# Patient Record
Sex: Male | Born: 1980 | Hispanic: Yes | Smoking: Never smoker
Health system: Southern US, Community
[De-identification: ages and names within clinical notes are randomized; demographics above are authoritative.]

## PROBLEM LIST (undated history)

## (undated) DIAGNOSIS — I8390 Asymptomatic varicose veins of unspecified lower extremity: Secondary | ICD-10-CM

## (undated) DIAGNOSIS — E785 Hyperlipidemia, unspecified: Secondary | ICD-10-CM

## (undated) HISTORY — DX: Hyperlipidemia, unspecified: E78.5

## (undated) HISTORY — DX: Asymptomatic varicose veins of unspecified lower extremity: I83.90

## (undated) HISTORY — PX: LASER ABLATION: SHX1947

## (undated) HISTORY — PX: MENISCECTOMY: SHX123

---

## 2015-03-21 ENCOUNTER — Encounter: Payer: Self-pay | Admitting: Vascular Surgery

## 2015-03-21 ENCOUNTER — Other Ambulatory Visit: Payer: Self-pay

## 2015-03-21 DIAGNOSIS — I83812 Varicose veins of left lower extremities with pain: Secondary | ICD-10-CM

## 2015-04-17 ENCOUNTER — Emergency Department (HOSPITAL_COMMUNITY): Payer: BLUE CROSS/BLUE SHIELD

## 2015-04-17 ENCOUNTER — Emergency Department (HOSPITAL_COMMUNITY)
Admission: EM | Admit: 2015-04-17 | Discharge: 2015-04-17 | Disposition: A | Discharge status: Home / Self Care | Payer: BLUE CROSS/BLUE SHIELD | Attending: Emergency Medicine | Admitting: Emergency Medicine

## 2015-04-17 ENCOUNTER — Encounter (HOSPITAL_COMMUNITY): Payer: Self-pay | Admitting: *Deleted

## 2015-04-17 DIAGNOSIS — X58XXXA Exposure to other specified factors, initial encounter: Secondary | ICD-10-CM | POA: Insufficient documentation

## 2015-04-17 DIAGNOSIS — Y9366 Activity, soccer: Secondary | ICD-10-CM | POA: Diagnosis not present

## 2015-04-17 DIAGNOSIS — S8992XA Unspecified injury of left lower leg, initial encounter: Secondary | ICD-10-CM | POA: Insufficient documentation

## 2015-04-17 DIAGNOSIS — Z8679 Personal history of other diseases of the circulatory system: Secondary | ICD-10-CM | POA: Insufficient documentation

## 2015-04-17 DIAGNOSIS — Y92322 Soccer field as the place of occurrence of the external cause: Secondary | ICD-10-CM | POA: Diagnosis not present

## 2015-04-17 DIAGNOSIS — Y999 Unspecified external cause status: Secondary | ICD-10-CM | POA: Insufficient documentation

## 2015-04-17 DIAGNOSIS — Z8639 Personal history of other endocrine, nutritional and metabolic disease: Secondary | ICD-10-CM | POA: Insufficient documentation

## 2015-04-17 NOTE — ED Notes (Signed)
Patient transported to X-ray 

## 2015-04-17 NOTE — ED Notes (Signed)
Declined W/C at D/C and was escorted to lobby by RN. 

## 2015-04-17 NOTE — Discharge Instructions (Signed)
Please follow-up immediately with the orthopedic specialist for further evaluation and management. Most likely have a significant injury to knee that needs evaluation. Please use Tylenol, ibuprofen as needed for pain. Please use ice, rest, Ace bandage. Continue wearing your knee brace as needed.

## 2015-04-17 NOTE — ED Notes (Signed)
Pt reports injuring left knee while playing soccer. Pt ambulatory.

## 2015-04-17 NOTE — ED Provider Notes (Signed)
CSN: [id number]     Arrival date & time 04/17/15  4098 History  This chart was scribed for non-physician practitioner, Eyvonne Mechanic, PA-C working with Melene Plan, DO by Gwenyth Ober, ED scribe. This patient was seen in room TR07C/TR07C and the patient's care was started at 9:43 AM   Chief Complaint  Patient presents with  . Knee Injury   The history is provided by the patient. No language interpreter was used.   HPI Comments: Marc Chen is a 34 y.o. male who presents to the Emergency Department complaining of constant, moderate left knee pain that started 3 weeks ago. He states gradually improving swelling as an associated symptom. Pt ambulates with pain. He also notes an intermittent sensation of instability in the affected knee while walking. Pt reports the onset of pain started after he was pushed while playing soccer and rotated his knee medially. He heard a pop after the episode and states he was unable to ambulate at that time secondary to the pain. Pt has a history of left knee injuries which had been treated by a friend. He denies numbness.  Past Medical History  Diagnosis Date  . Hyperlipidemia   . Varicose veins    History reviewed. No pertinent past surgical history. Family History  Problem Relation Age of Onset  . Diabetes Father    Social History  Substance Use Topics  . Smoking status: Never Smoker   . Smokeless tobacco: None  . Alcohol Use: None    Review of Systems  All other systems reviewed and are negative.     Allergies  Review of patient's allergies indicates no known allergies.  Home Medications   Prior to Admission medications   Not on File   BP 143/93 mmHg  Pulse 65  Temp(Src) 98.2 F (36.8 C) (Oral)  Resp 15  Ht  (1.676 m)  Wt 178 lb (80.74 kg)  BMI 28.74 kg/m2  SpO2 100% Physical Exam  Constitutional: He appears well-developed and well-nourished. No distress.  HENT:  Head: Normocephalic and atraumatic.  Eyes:  Conjunctivae and EOM are normal.  Neck: Neck supple. No tracheal deviation present.  Cardiovascular: Normal rate.   Pulmonary/Chest: Effort normal. No respiratory distress.  Musculoskeletal: He exhibits edema and tenderness.  Left knee: Obvious knee effusion to left knee Positive Lachman's  Positive valgus stress Negative varus Negative posterior drawer Negative patellar grind TTP of the anterior joint line and MCL Ambulates with minimal difficulty  Pt also has enlarged veins on the posterior medial right knee, compressible, decreased with elevation. No lower extremity swelling or edema; equal bilateral    Skin: Skin is warm and dry.  Psychiatric: He has a normal mood and affect. His behavior is normal.  Nursing note and vitals reviewed.   ED Course  Procedures   DIAGNOSTIC STUDIES: Oxygen Saturation is 100% on RA, normal by my interpretation.    COORDINATION OF CARE: 9:50 AM Discussed treatment plan with pt which includes an ACE wrap and referral to orthopedics. He agreed to plan.   Labs Review Labs Reviewed - No data to display  Imaging Review Dg Knee Complete 4 Views Left  04/17/2015   CLINICAL DATA:  Injury 3 weeks ago playing soccer. Knee pain along medial side.  EXAM: LEFT KNEE - COMPLETE 4+ VIEW  COMPARISON:  None.  FINDINGS: There is a moderate joint effusion. No acute bony abnormality. Specifically, no fracture, subluxation, or dislocation. Soft tissues are intact.  IMPRESSION: Moderate joint effusion.  No acute  bony abnormality.   Electronically Signed   By: Charlett Nose M.D.   On: 04/17/2015 09:19   I have personally reviewed and evaluated these images as part of my medical decision-making.   EKG Interpretation None      MDM   Final diagnoses:  Knee injury, left, initial encounter   Labs:    Imaging: x-ray left knee shows moderate joint effusion, but no acute body abnormalities  Consults:   Therapeutics: ACE wrap  Discharge Meds:    Assessment/Plan: Suspect tear of ACL and/or MCL. Advised pt to use OTC Tylenol or Ibuprofen for pain, as needed. Will wrap in an ACE wrap, continue using immobilization brace,  and follow up with orthopedics.  I personally performed the services described in this documentation, which was scribed in my presence. The recorded information has been reviewed and is accurate.  Eyvonne Mechanic, PA-C 04/18/15 1846  Melene Plan, DO 04/19/15 1610

## 2015-06-06 ENCOUNTER — Encounter: Payer: Self-pay | Admitting: Vascular Surgery

## 2015-06-11 ENCOUNTER — Ambulatory Visit (HOSPITAL_COMMUNITY)
Admission: RE | Admit: 2015-06-11 | Discharge: 2015-06-11 | Disposition: A | Discharge status: Home / Self Care | Payer: BLUE CROSS/BLUE SHIELD | Source: Ambulatory Visit | Attending: Vascular Surgery | Admitting: Vascular Surgery

## 2015-06-11 ENCOUNTER — Ambulatory Visit (INDEPENDENT_AMBULATORY_CARE_PROVIDER_SITE_OTHER): Payer: BLUE CROSS/BLUE SHIELD | Admitting: Vascular Surgery

## 2015-06-11 ENCOUNTER — Encounter: Payer: Self-pay | Admitting: Vascular Surgery

## 2015-06-11 VITALS — BP 140/89 | HR 51 | Temp 98.0°F | Resp 16 | Ht 66.5 in | Wt 176.0 lb

## 2015-06-11 DIAGNOSIS — I83893 Varicose veins of bilateral lower extremities with other complications: Secondary | ICD-10-CM

## 2015-06-11 DIAGNOSIS — I83812 Varicose veins of left lower extremities with pain: Secondary | ICD-10-CM | POA: Diagnosis not present

## 2015-06-11 DIAGNOSIS — E785 Hyperlipidemia, unspecified: Secondary | ICD-10-CM | POA: Insufficient documentation

## 2015-06-11 NOTE — Progress Notes (Signed)
Subjective:     Patient ID: Marc Chen, male   DOB: November 09, 1980, 34 y.o.   MRN: [id number]  HPI this 34 year old male was referred for evaluation of painful varicosities in the left leg. Patient states these have been present for at least 5 years gradually enlarging and gradually becoming more symptomatic. He describes aching throbbing and burning discomfort in the lower thigh and medial calf area of the left leg with some swelling as a day progresses. He does not really last to compression stockings nor elevate his legs on regular basis. He has no history of DVT thrombophlebitis stasis ulcers or bleeding. He has no symptoms in the contralateral right leg. He attributes these problems to a fall from a tree 20 years ago where he injured the left leg but he did not develop varicosities for the next 10-15 years.  Past Medical History  Diagnosis Date  . Hyperlipidemia   . Varicose veins     Social History  Substance Use Topics  . Smoking status: Never Smoker   . Smokeless tobacco: Not on file  . Alcohol Use: 0.6 oz/week    1 Cans of beer per week    Family History  Problem Relation Age of Onset  . Diabetes Father     No Known Allergies  No current outpatient prescriptions on file.  Filed Vitals:   06/11/15 1107 06/11/15 1108  BP: 135/101 140/89  Pulse: 51 51  Temp: 98 F (36.7 C)   Resp: 16   Height: 5' 6.5" (1.689 m)   Weight: 176 lb (79.833 kg)   SpO2: 99%     Body mass index is 27.98 kg/(m^2).           Review of Systems denies chest pain, dyspnea on exertion, PND, orthopnea, hemoptysis. Totally negative review of systems in this healthy    Objective:   Physical Exam BP 140/89 mmHg  Pulse 51  Temp(Src) 98 F (36.7 C)  Resp 16  Ht 5' 6.5" (1.689 m)  Wt 176 lb (79.833 kg)  BMI 27.98 kg/m2  SpO2 99%  Gen.-alert and oriented x3 in no apparent distress HEENT normal for age Lungs no rhonchi or wheezing Cardiovascular regular rhythm no murmurs carotid  pulses 3+ palpable no bruits audible Abdomen soft nontender no palpable masses Musculoskeletal free of  major deformities Skin clear -no rashes Neurologic normal Lower extremities 3+ femoral and dorsalis pedis pulses palpable bilaterally with no edema on the right 1+ edema on the left Left leg with large nest of bulging varicosities beginning in the distal medial thigh over the great saphenous vein extending down the cath into the medial malleolar area area also varicosities in the anterolateral aspect below the knee over the anterior compartment. No hyperpigmentation or ulceration is noted.  Today I ordered a venous duplex exam of the left leg which I reviewed and interpreted. There is no DVT. There is gross reflux throughout a large caliber great saphenous vein from the mid calf to the saphenofemoral junction. Left small saphenous vein has no reflux.     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

## 2015-06-11 NOTE — Progress Notes (Signed)
Filed Vitals:   06/11/15 1107 06/11/15 1108  BP: 135/101 140/89  Pulse: 51 51  Temp: 98 F (36.7 C)   Resp: 16   Height: 5' 6.5" (1.689 m)   Weight: 176 lb (79.833 kg)   SpO2: 99%

## 2015-09-03 ENCOUNTER — Encounter: Payer: Self-pay | Admitting: Vascular Surgery

## 2015-09-10 ENCOUNTER — Ambulatory Visit (INDEPENDENT_AMBULATORY_CARE_PROVIDER_SITE_OTHER): Payer: BLUE CROSS/BLUE SHIELD | Admitting: Vascular Surgery

## 2015-09-10 ENCOUNTER — Encounter: Payer: Self-pay | Admitting: Vascular Surgery

## 2015-09-10 VITALS — BP 131/87 | HR 58 | Ht 66.5 in | Wt 176.3 lb

## 2015-09-10 DIAGNOSIS — I83892 Varicose veins of left lower extremities with other complications: Secondary | ICD-10-CM | POA: Diagnosis not present

## 2015-09-10 NOTE — Progress Notes (Signed)
Subjective:     Patient ID: Marc Chen, male   DOB: 23-Oct-1980, 35 y.o.   MRN: [id number]  HPI this 35 year old male returns for continued follow-up regarding his painful varicosities in the left leg. He has had these large bulging varicosities in the distal thigh and calf for many years. These have gradually enlarged and have become quite painful. They cause aching throbbing and burning discomfort as well as swelling in the ankle as the day progresses. He is tried long-leg elastic compression stockings 20-30 mm gradient as well as elevation and ibuprofen with no improvement in his symptoms. This is affecting his daily living.  Past Medical History  Diagnosis Date  . Hyperlipidemia   . Varicose veins     Social History  Substance Use Topics  . Smoking status: Never Smoker   . Smokeless tobacco: Not on file  . Alcohol Use: 0.6 oz/week    1 Cans of beer per week    Family History  Problem Relation Age of Onset  . Diabetes Father     No Known Allergies  No current outpatient prescriptions on file.  Filed Vitals:   09/10/15 0839  BP: 131/87  Pulse: 58  Height: 5' 6.5" (1.689 m)  Weight: 176 lb 4.8 oz (79.969 kg)  SpO2: 98%    Body mass index is 28.03 kg/(m^2).           Review of Systems denies chest pain, dyspnea on exertion, PND, orthopnea, hemoptysis     Objective:   Physical Exam BP 131/87 mmHg  Pulse 58  Ht 5' 6.5" (1.689 m)  Wt 176 lb 4.8 oz (79.969 kg)  BMI 28.03 kg/m2  SpO2 98%  Gen. well-developed well-nourished male in no apparent distress alert and oriented 3 Lungs no rhonchi or wheezing Left leg with large bulging varicosities beginning in the distal thigh over the great saphenous vein extending into the medial calf both anteriorly and posteriorly and down into the ankle area medially. 1+ edema noted. No hyperpigmentation or ulceration noted.  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 Symptoms are resistant to conservative measures including long-leg elastic compression stockings 20-30 mm gradient, elevation, and ibuprofen. The symptoms are affecting patient's daily living.    Plan:     Patient needs laser ablation left great saphenous vein plus greater than 20 stab phlebectomy painful varicosities to relieve his symptoms We will proceed with precertification to perform this in the near future

## 2015-09-11 ENCOUNTER — Other Ambulatory Visit: Payer: Self-pay | Admitting: *Deleted

## 2015-09-11 DIAGNOSIS — I83812 Varicose veins of left lower extremities with pain: Secondary | ICD-10-CM

## 2015-09-30 ENCOUNTER — Encounter: Payer: Self-pay | Admitting: Vascular Surgery

## 2015-10-07 ENCOUNTER — Encounter: Payer: Self-pay | Admitting: Vascular Surgery

## 2015-10-07 ENCOUNTER — Ambulatory Visit (INDEPENDENT_AMBULATORY_CARE_PROVIDER_SITE_OTHER): Payer: BLUE CROSS/BLUE SHIELD | Admitting: Vascular Surgery

## 2015-10-07 VITALS — BP 131/86 | HR 61 | Temp 97.8°F | Resp 16 | Ht 67.0 in | Wt 176.0 lb

## 2015-10-07 DIAGNOSIS — I83892 Varicose veins of left lower extremities with other complications: Secondary | ICD-10-CM

## 2015-10-07 NOTE — Progress Notes (Signed)
Laser Ablation Procedure    Date: 10/07/2015   Marc Chen DOB:01/14/1981  Consent signed: Yes    Surgeon:  Dr. Quita SkyeJames D. Hart RochesterLawson  Procedure: Laser Ablation: left Greater Saphenous Vein  BP 131/86 mmHg  Pulse 61  Temp(Src) 97.8 F (36.6 C)  Resp 16  Ht 5\' 7"  (1.702 m)  Wt 176 lb (79.833 kg)  BMI 27.56 kg/m2  SpO2 98%  Tumescent Anesthesia: 500 cc 0.9% NaCl with 50 cc Lidocaine HCL with 1% Epi and 15 cc 8.4% NaHCO3  Local Anesthesia: 10 cc Lidocaine HCL and NaHCO3 (ratio 2:1)  Pulsed Mode: 15 watts, 500ms delay, 1.0 duration  Total Energy:1550              Total Pulses: 104               Total Time: 1:43    Stab Phlebectomy: >20 Sites: Thigh, Calf and Ankle  Patient tolerated procedure well  Notes:   Description of Procedure:  After marking the course of the secondary varicosities, the patient was placed on the operating table in the supine position, and the left leg was prepped and draped in sterile fashion.   Local anesthetic was administered and under ultrasound guidance the saphenous vein was accessed with a micro needle and guide wire; then the mirco puncture sheath was placed.  A guide wire was inserted saphenofemoral junction , followed by a 5 french sheath.  The position of the sheath and then the laser fiber below the junction was confirmed using the ultrasound.  Tumescent anesthesia was administered along the course of the saphenous vein using ultrasound guidance. The patient was placed in Trendelenburg position and protective laser glasses were placed on patient and staff, and the laser was fired at 15 watts continuous mode advancing 1-702mm/second for a total of 1550 joules.   For stab phlebectomies, local anesthetic was administered at the previously marked varicosities, and tumescent anesthesia was administered around the vessels.  Greater than 20 stab wounds were made using the tip of an 11 blade. And using the vein hook, the phlebectomies were performed using  a hemostat to avulse the varicosities.  Adequate hemostasis was achieved.     Steri strips were applied to the stab wounds and ABD pads and thigh high compression stockings were applied.  Ace wrap bandages were applied over the phlebectomy sites and at the top of the saphenofemoral junction. Blood loss was less than 15 cc.  The patient ambulated out of the operating room having tolerated the procedure well.

## 2015-10-07 NOTE — Progress Notes (Signed)
Subjective:     Patient ID: Marc Chen, male   DOB: 03/08/1981, 35 y.o.   MRN: [id number]  HPI this 35 year old male had laser ablation of the left great saphenous vein from the knee to near the saphenofemoral junction plus greater than 20 stab phlebectomy of painful varicosities (approximately 35) performed under local tumescent anesthesia. Total of 1550 J of energy was utilized. He tolerated the procedure well.   Review of Systems     Objective:   Physical Exam BP 131/86 mmHg  Pulse 61  Temp(Src) 97.8 F (36.6 C)  Resp 16  Ht 5\' 7"  (1.702 m)  Wt 176 lb (79.833 kg)  BMI 27.56 kg/m2  SpO2 98%       Assessment:     Well-tolerated laser ablation left great saphenous vein plus greater than 20 stab phlebectomy of painful varicosities performed under local tumescent anesthesia    Plan:     Return in 1 week for venous duplex exam to confirm closure left great saphenous vein and this will complete patient's treatment regimen

## 2015-10-08 ENCOUNTER — Telehealth: Payer: Self-pay | Admitting: *Deleted

## 2015-10-08 NOTE — Telephone Encounter (Signed)
Pt doing well. No bledding. Told him it was ok to loosen the ace wraps. He is following all instructions. Follow prn.

## 2015-10-09 ENCOUNTER — Encounter: Payer: Self-pay | Admitting: Vascular Surgery

## 2015-10-14 ENCOUNTER — Ambulatory Visit (HOSPITAL_COMMUNITY)
Admission: RE | Admit: 2015-10-14 | Discharge: 2015-10-14 | Disposition: A | Discharge status: Home / Self Care | Payer: BLUE CROSS/BLUE SHIELD | Source: Ambulatory Visit | Attending: Vascular Surgery | Admitting: Vascular Surgery

## 2015-10-14 ENCOUNTER — Ambulatory Visit (INDEPENDENT_AMBULATORY_CARE_PROVIDER_SITE_OTHER): Payer: Self-pay | Admitting: Vascular Surgery

## 2015-10-14 ENCOUNTER — Encounter: Payer: Self-pay | Admitting: Vascular Surgery

## 2015-10-14 VITALS — BP 138/85 | HR 72 | Temp 97.5°F | Resp 16 | Ht 66.5 in | Wt 176.0 lb

## 2015-10-14 DIAGNOSIS — I83812 Varicose veins of left lower extremities with pain: Secondary | ICD-10-CM | POA: Diagnosis not present

## 2015-10-14 DIAGNOSIS — E785 Hyperlipidemia, unspecified: Secondary | ICD-10-CM | POA: Insufficient documentation

## 2015-10-14 DIAGNOSIS — Z9889 Other specified postprocedural states: Secondary | ICD-10-CM | POA: Insufficient documentation

## 2015-10-14 DIAGNOSIS — I83892 Varicose veins of left lower extremities with other complications: Secondary | ICD-10-CM

## 2015-10-14 NOTE — Progress Notes (Signed)
Subjective:     Patient ID: Marc Chen, male   DOB: 10/06/1980, 35 y.o.   MRN: [id number]  HPI this 35 year old male returns 1 week post-laser ablation left great saphenous vein from the knee to near the saphenofemoral junction plus greater than 20 stab phlebectomy of painful varicosities. He has had mild to moderate discomfort in the medial and proximal thigh area as one would expect. He has had no chest pain dyspnea on exertion PND orthopnea or hemoptysis. He has not had any distal edema. He's had no pain at the stab phlebectomy sites. He has worn his elastic compression stocking as instructed and taken ibuprofen.   Review of Systems     Objective:   Physical Exam BP 138/85 mmHg  Pulse 72  Temp(Src) 97.5 F (36.4 C)  Resp 16  Ht 5' 6.5" (1.689 m)  Wt 176 lb (79.833 kg)  BMI 27.98 kg/m2  SpO2 98%  Gen. well-developed well-nourished male no apparent distress alert and oriented 3 Lungs no rhonchi or wheezing Left leg with nicely healing stab phlebectomy sites with Steri-Strips in place Mild discomfort to deep palpation over great saphenous vein with vein palpable beneath the skin No distal edema noted with 3+ dorsalis pedis pulse  Today I ordered a venous duplex exam of the left leg which I reviewed and interpreted. There is no DVT. There is total closure of the great saphenous vein from distal thigh to near the saphenofemoral junction     Assessment:     Successful laser ablation left great saphenous vein plus greater than 20 stab phlebectomy of painful varicosities-doing well    Plan:     Return to see me on when necessary basis

## 2017-01-19 IMAGING — DX DG KNEE COMPLETE 4+V*L*
4 series · 4 of 4 positions shown · non-contrast
Comparison: None.

CLINICAL DATA: Injury 3 weeks ago playing soccer. Knee pain along
medial side.

EXAM:
LEFT KNEE - COMPLETE 4+ VIEW

[knee ap]
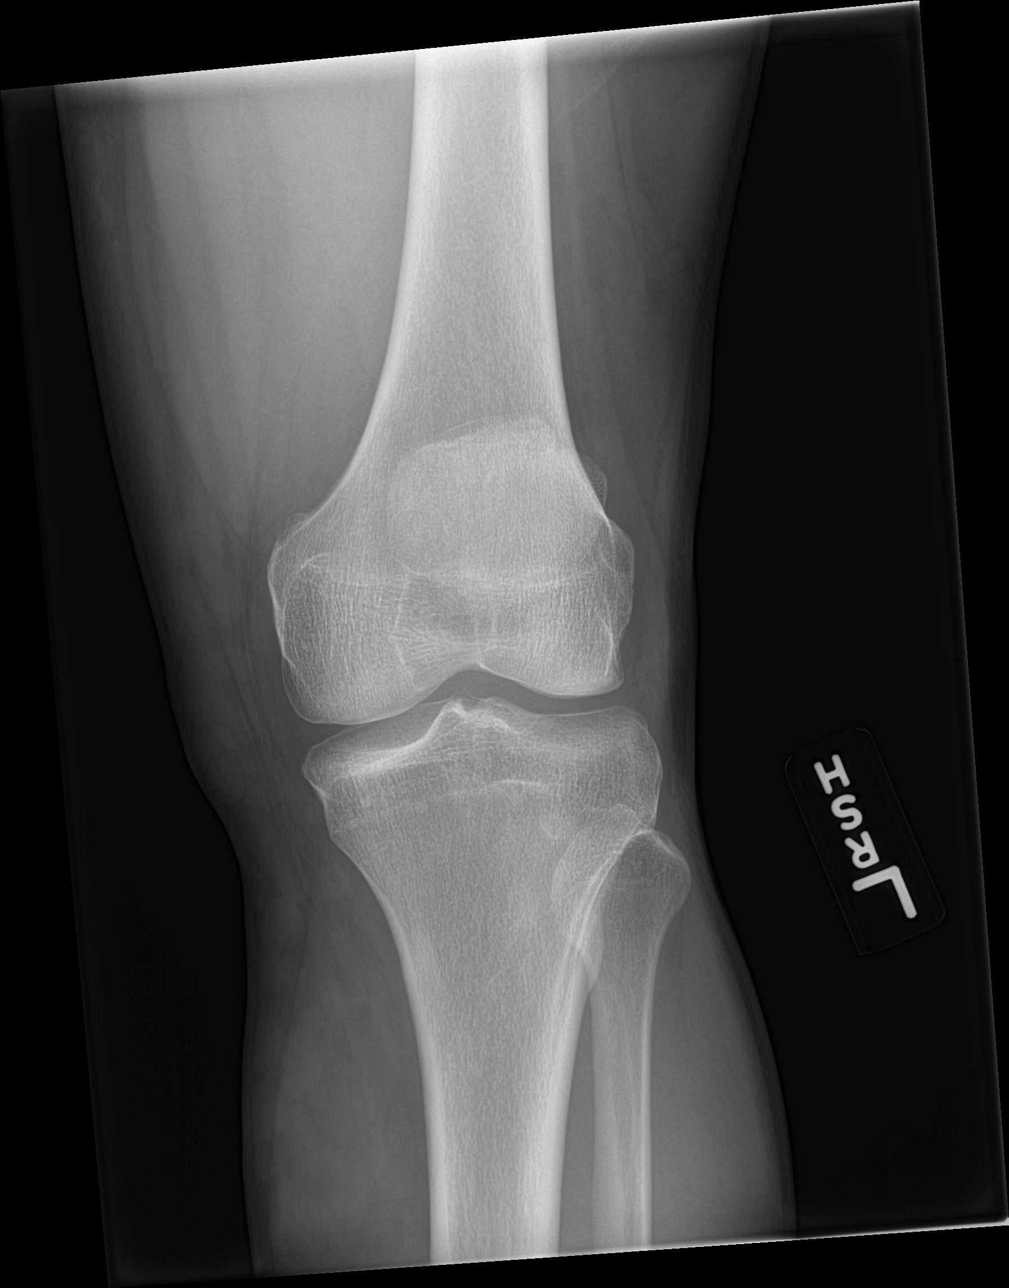

[knee lat]
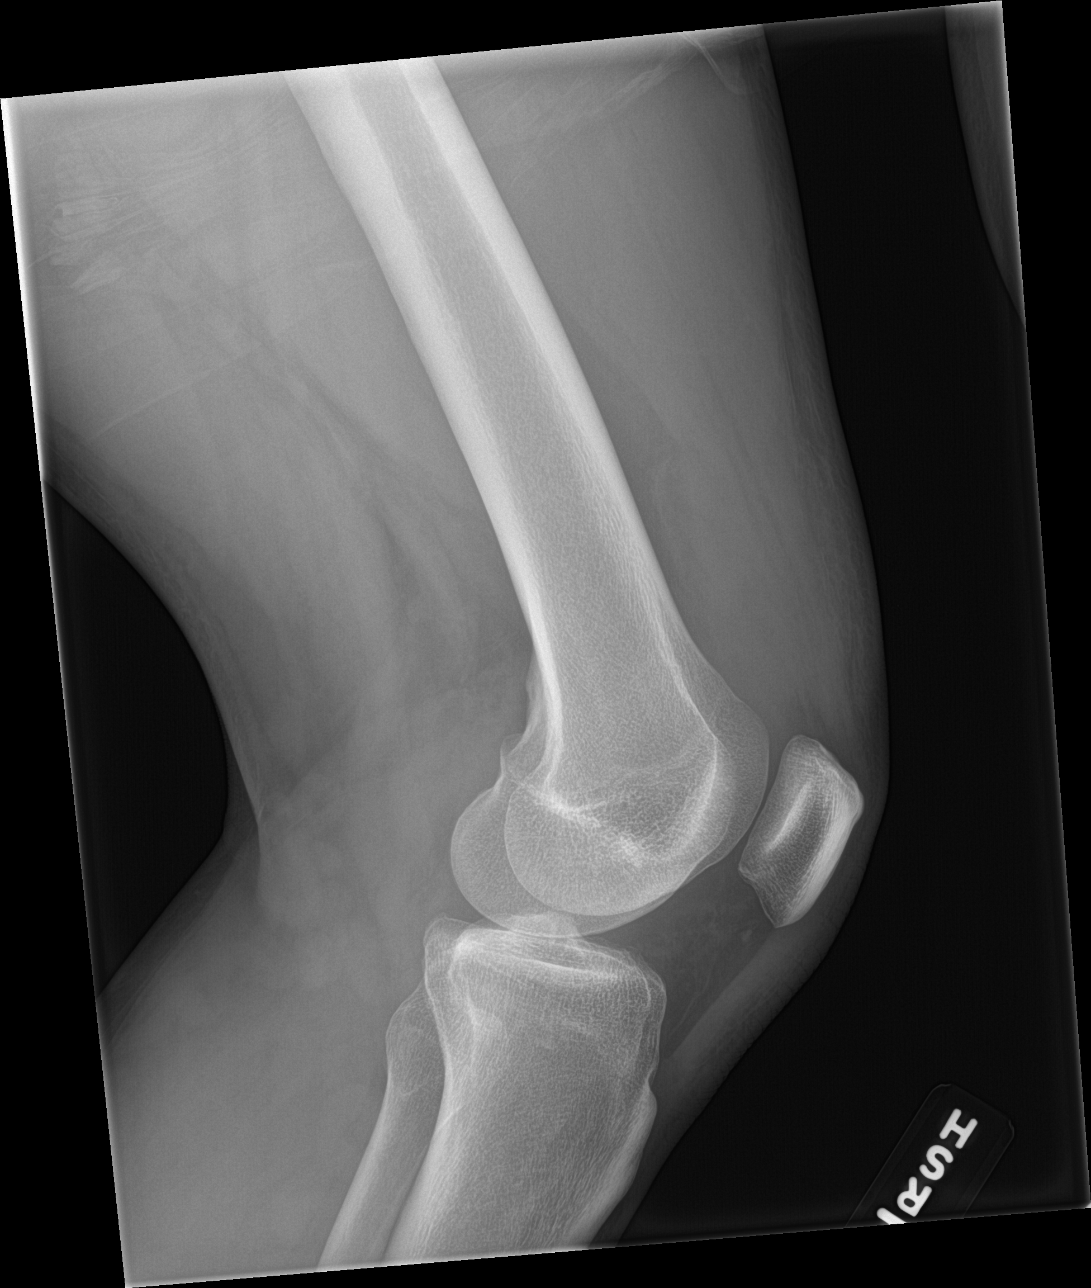

[knee obl (1 of 2)]
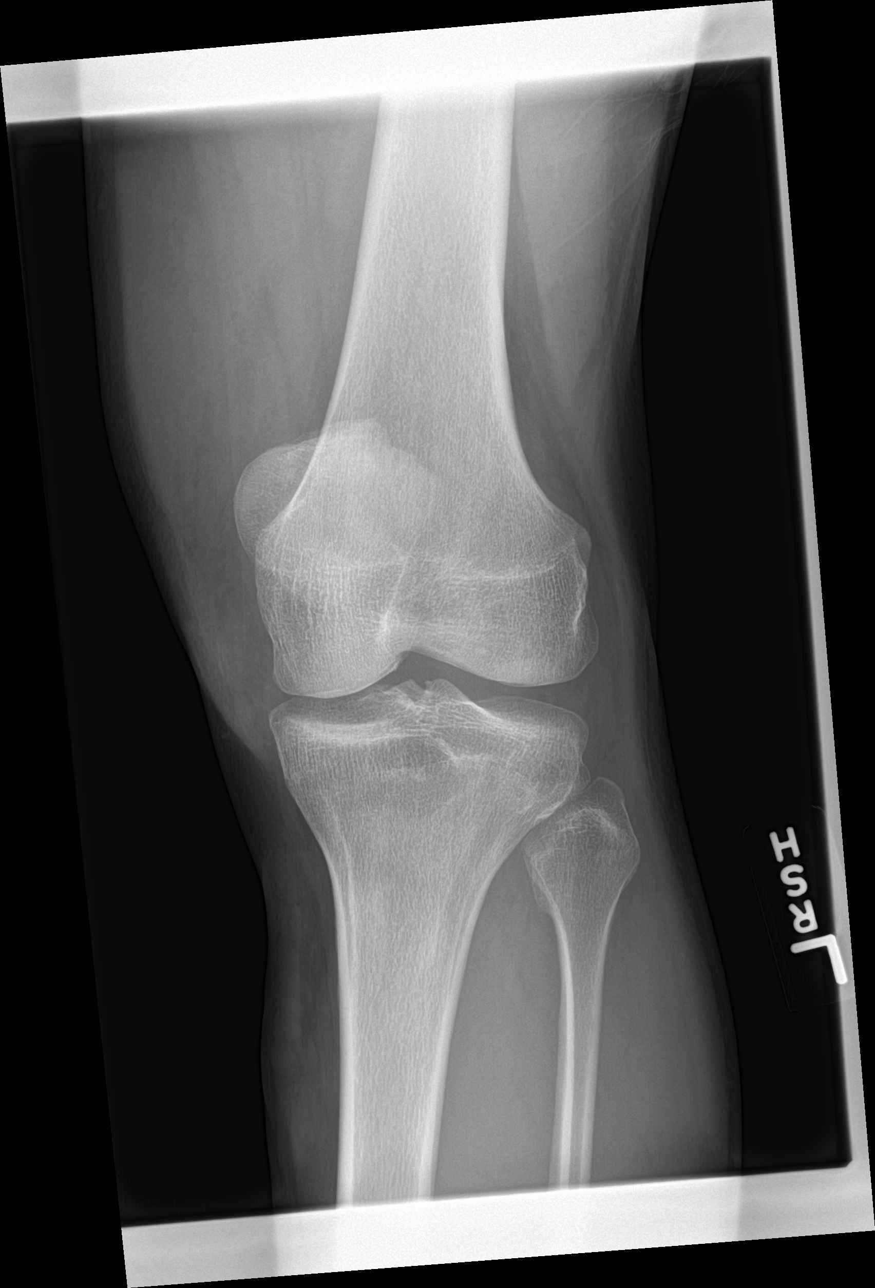

[knee obl (2 of 2)]
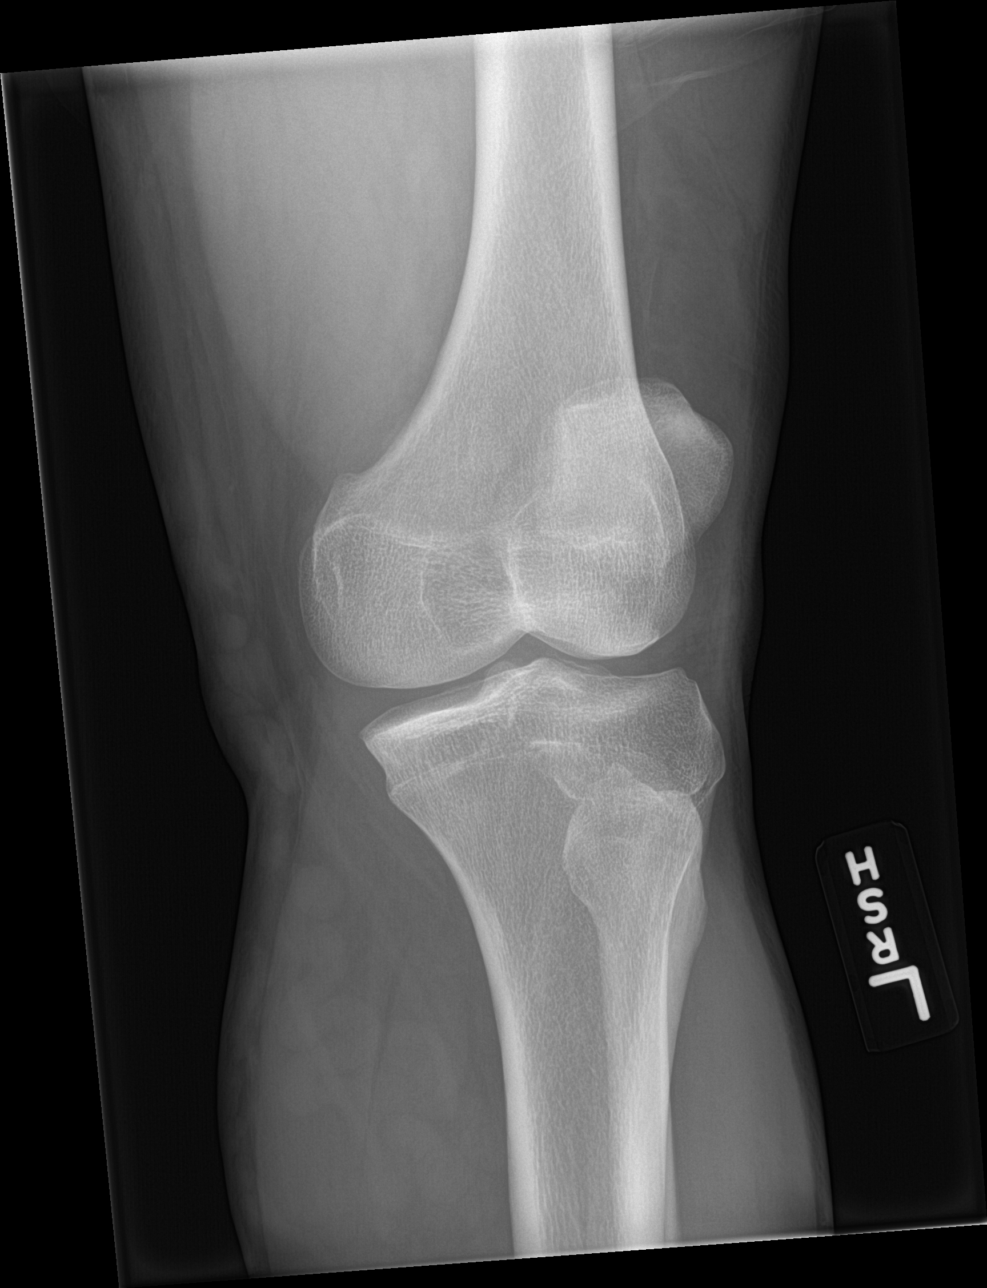

[4 of 4 positions shown; findings below may reference images not displayed]

FINDINGS: There is a moderate joint effusion. No acute bony abnormality.
Specifically, no fracture, subluxation, or dislocation. Soft tissues
are intact.
IMPRESSION: Moderate joint effusion.  No acute bony abnormality.

## 2018-08-24 ENCOUNTER — Ambulatory Visit: Payer: BLUE CROSS/BLUE SHIELD | Admitting: Family Medicine

## 2018-08-31 ENCOUNTER — Ambulatory Visit: Payer: Self-pay | Attending: Family Medicine

## 2018-09-23 ENCOUNTER — Encounter: Payer: Self-pay | Admitting: Internal Medicine

## 2018-09-23 ENCOUNTER — Ambulatory Visit: Payer: Self-pay

## 2018-09-23 ENCOUNTER — Ambulatory Visit: Payer: Self-pay | Attending: Internal Medicine | Admitting: Internal Medicine

## 2018-09-23 VITALS — BP 130/84 | HR 54 | Temp 97.6°F | Resp 16 | Ht 66.0 in | Wt 183.0 lb

## 2018-09-23 DIAGNOSIS — R140 Abdominal distension (gaseous): Secondary | ICD-10-CM | POA: Insufficient documentation

## 2018-09-23 DIAGNOSIS — R143 Flatulence: Secondary | ICD-10-CM

## 2018-09-23 DIAGNOSIS — R030 Elevated blood-pressure reading, without diagnosis of hypertension: Secondary | ICD-10-CM | POA: Insufficient documentation

## 2018-09-23 DIAGNOSIS — G5603 Carpal tunnel syndrome, bilateral upper limbs: Secondary | ICD-10-CM | POA: Insufficient documentation

## 2018-09-23 DIAGNOSIS — Z23 Encounter for immunization: Secondary | ICD-10-CM | POA: Insufficient documentation

## 2018-09-23 NOTE — Progress Notes (Signed)
Patient ID: Marc Chen, male    DOB: August 15, 1980  MRN: [id number]  CC: New Patient (Initial Visit)   Subjective: Marc Chen is a 38 y.o. male who presents for new pt visit.  Interpreter, Lillia Mountain, from [employer], is with him. Wife and young daughter also present His concerns today include:   No previous primary.  Pt c/o "hands feel swollen and numb" x 1 yr.  "If I work a lot my hands can go to sleep on me." +numbness that wakes him at nights.  No weakness. Little pain in hands.   Uses hands a lot.  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  Reports that he uses an electric saw, does a lot of painting and uses a hammer quite regularly.   "I make a lot of gas. My stomach makes a lot of noise" X a lot of yrs. Not sure if worse with any particular foods.   Moving bowels okay.  No weight changes  Past surgical, family history and social history reviewed. Patient Active Problem List   Diagnosis Date Noted  . Varicose veins of left lower extremity with complications 09/10/2015  . Symptomatic varicose veins of both lower extremities 06/11/2015     No current outpatient medications on file prior to visit.   No current facility-administered medications on file prior to visit.     No Known Allergies  Social History   Socioeconomic History  . Marital status: Married    Spouse name: Not on file  . Number of children: Not on file  . Years of education: 9 grade  . Highest education level: Not on file  Occupational History  . Not on file  Social Needs  . Financial resource strain: Not on file  . Food insecurity:    Worry: Not on file    Inability: Not on file  . Transportation needs:    Medical: Not on file    Non-medical: Not on file  Tobacco Use  . Smoking status: Never Smoker  . Smokeless tobacco: Never Used  Substance and Sexual Activity  . Alcohol use: Yes    Alcohol/week: 1.0 standard drinks    Types: 1 Cans of beer per week    . Drug use: No  . Sexual activity: Yes  Lifestyle  . Physical activity:    Days per week: Not on file    Minutes per session: Not on file  . Stress: Not on file  Relationships  . Social connections:    Talks on phone: Not on file    Gets together: Not on file    Attends religious service: Not on file    Active member of club or organization: Not on file    Attends meetings of clubs or organizations: Not on file    Relationship status: Not on file  . Intimate partner violence:    Fear of current or ex partner: Not on file    Emotionally abused: Not on file    Physically abused: Not on file    Forced sexual activity: Not on file  Other Topics Concern  . Not on file  Social History Narrative  . Not on file    Family History  Problem Relation Age of Onset  . Diabetes Father     Past Surgical History:  Procedure Laterality Date  . LASER ABLATION Left   . MENISCECTOMY Left     ROS: Review of Systems  Cardiovascular: Negative for chest pain and  palpitations.       Blood pressure noted to be elevated.  In review of EMR I see that blood pressure has been elevated on visits with physician outside of this clinic.  Patient feels he gets nervous when he is in a physician's office.  No prior history of hypertension.  Gastrointestinal: Negative for blood in stool and nausea.   PHYSICAL EXAM: BP 130/84   Pulse (!) 54   Temp 97.6 F (36.4 C) (Oral)   Resp 16   Ht 5\' 6"  (1.676 m)   Wt 183 lb (83 kg)   SpO2 99%   BMI 29.54 kg/m   Physical Exam Repeat BP BP 130/84  General appearance - alert, well appearing, middle-aged Hispanic male and in no distress Mental status - normal mood, behavior, speech, dress, motor activity, and thought processes Eyes - pupils equal and reactive, extraocular eye movements intact Chest - clear to auscultation, no wheezes, rales or rhonchi, symmetric air entry Heart - normal rate, regular rhythm, normal S1, S2, no murmurs, rubs, clicks or  gallops GI: Normal bowel sounds, nondistended, soft and nontender Neurological -no wasting of intrinsic hand muscles.  Grip 5/5 bilaterally.  Power in upper extremities 5/5 bilaterally.  Gross and fine sensation intact in both hands and upper extremities.  Tinel sign negative. Extremities -no lower extremity edema.  Radial and brachial pulses 3+ bilaterally   ASSESSMENT AND PLAN: 1. Bilateral carpal tunnel syndrome Diagnosis discussed with patient in detail.  Most likely related to repetitive use of the hands given his line of work.  Discussed treatment options.  We will start with use of wrist splints which he can wear at nights and at work if he is able to work with the splints on.  We only had splint for the left hand today.  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 Prehypertension Discussed diagnosis.  Patient feels he has a component of whitecoat hypertension as he gets nervous whenever he visits a physician's office.  DASH diet discussed and encouraged.  Advised him to try to check blood pressure at least twice a month at any local pharmacy that is close to him and bring in readings on next visit.  3. Excessive gas Advised to keep a diary of foods that may cause excessive bloating.  At this time this is nothing that is life-threatening.  4. Need for immunization against influenza Given  5. Need for diphtheria-tetanus-pertussis (Tdap) vaccine Given     Patient was given the opportunity to ask questions.  Patient verbalized understanding of the plan and was able to repeat key elements of the plan.   No orders of the defined types were placed in this encounter.    Requested Prescriptions    No prescriptions requested or ordered in this encounter    Return in about 6 weeks (around 11/04/2018).  Jonah Blue, MD, FACP

## 2018-09-23 NOTE — Patient Instructions (Addendum)
Sndrome del tnel carpiano Carpal Tunnel Syndrome  El sndrome del tnel carpiano es una afeccin que causa dolor en la mano y en el brazo. El tnel carpiano es un rea estrecha que se encuentra en el lado palmar de la [place]. Los movimientos repetidos de la mueca o determinadas enfermedades pueden causar hinchazn en el tnel. Esta hinchazn puede comprimir el nervio principal de la mueca (nervio mediano). Cules son las causas? Esta afeccin puede ser causada por lo siguiente:  Movimientos repetidos de [employer]  Lesiones en la [place].  Artritis.  Un saco lleno de lquido (quiste) o un crecimiento anormal (tumor) en el tnel carpiano.  Acumulacin de lquido Academic librarian. Algunas veces, la causa no se conoce. Qu incrementa el riesgo? Los siguientes factores pueden hacer que sea ms propenso a Clinical cytogeneticist afeccin:  Tener un trabajo que exige mover la [place] del mismo modo muchas veces. Esto incluye trabajos como el de carnicero o el de cajero.  Ser mujer.  Tener otras afecciones, por ejemplo: ? Diabetes. ? Obesidad. ? Una glndula tiroidea que no es lo suficientemente activa (hipotiroidismo). ? Insuficiencia renal. Cules son los signos o sntomas? Los sntomas de esta afeccin incluyen:  Sensacin de hormigueo en los dedos.  Hormigueo o prdida de la sensibilidad (adormecimiento) en la mano.  Dolor en todo el brazo. Este dolor puede empeorar al flexionar la [place] y el codo durante [place].  Dolor en la mueca que sube por el brazo hasta el hombro.  Dolor que baja hasta la palma de la mano o los dedos.  Sensacin de [employer]. Puede resultarle difcil tomar y Personal assistant. Es posible que se sienta peor por la noche. Cmo se diagnostica? Esta afeccin se diagnostica mediante los antecedentes mdicos y un examen fsico. Tambin pueden hacerle estudios, por ejemplo:  Una electromiograma (EMG). Este estudio comprueba las  seales que los nervios envan a los msculos.  Estudio de [employer]. Este estudio comprueba si las seales pasan correctamente por los nervios.  Estudios de diagnstico por imgenes, como radiografas, una ecografa y Hotel manager (RM). Estos estudios Education officer, community cul podra ser la causa de su afeccin. Cmo se trata? El tratamiento de esta afeccin puede incluir:  Cambios en el estilo de vida. Se le pedir que deje o cambie la actividad que caus el problema.  Hacer ejercicio y actividades para que los msculos y los huesos se vuelvan ms fuertes (fisioterapia).  Aprender a Hotel manager nuevamente (terapia ocupacional).  Medicamentos para tratar Chief Technology Officer y la hinchazn (inflamacin). Es posible que le apliquen inyecciones en la [place].  Una frula para la [place].  [place]. Siga estas indicaciones en su casa: Si tiene una frula:  Use la frula como se lo haya indicado el mdico. Quteselo solamente como se lo haya indicado el mdico.  Afloje la frula si los dedos: ? Hormiguean. ? Pierden la sensibilidad (se adormecen). ? Se tornan fros y de [employer].  Mantenga la frula limpia.  Si la frula no es impermeable: ? No deje que se moje. ? Cbralo con un envoltorio hermtico cuando tome un bao de inmersin o [place]. Control del dolor, la rigidez y la hinchazn   Si se lo indican, aplique hielo sobre la zona dolorida: ? Si tiene una frula desmontable, qutesela como se lo haya indicado el mdico. ? Ponga el hielo en una bolsa plstica. ? Coloque una FirstEnergy Corp piel y Copy. ? Coloque  el hielo durante , 2a3veces al da. Indicaciones generales  [employer] International de venta libre y los recetados solamente como se lo haya indicado el mdico.  Descanse la [place] de cualquier actividad que le cause dolor. Si es necesario, hable con su jefe en el Standard Pacific cambios que pueden ayudar a la curacin de la  [place].  Haga los ejercicios como se lo hayan indicado el mdico, el fisioterapeuta o el terapeuta ocupacional.  Concurra a todas las visitas de seguimiento como se lo haya indicado el mdico. Esto es importante. Comunquese con un mdico si:  Aparecen nuevos sntomas.  Los medicamentos no Tourist information centre manager.  Sus sntomas empeoran. Solicite ayuda de inmediato si:  Tiene adormecimiento u hormigueo muy intensos en la mueca o la [place]. Resumen  El sndrome del tnel carpiano es una afeccin que causa dolor en la mano y en el brazo.  Suele deberse a movimientos repetidos de Warden/ranger.  Este problema se trata mediante cambios en el estilo de vida y medicamentos. La ciruga puede ser necesaria en casos muy graves.  Siga las indicaciones del mdico sobre el uso de una frula, el reposo de la [place], la asistencia a las consultas de control y Freight forwarder para pedir ayuda. Esta informacin no tiene Theme park manager el consejo del mdico. Asegrese de hacerle al mdico cualquier pregunta que tenga. Document Released: 07/02/2011 Document Revised: 12/16/2017 Document Reviewed: 12/16/2017 Elsevier Interactive Patient Education  2019 Elsevier Inc. [place] Td (contra el ttanos y la difteria): lo que debe saber Td Vaccine (Tetanus and Diphtheria): What You Need to Know 1. Por qu vacunarse? El ttanos y la difteria son enfermedades muy graves. Son Academic librarian frecuentes en los [place] actualmente, pero las personas que se infectan suelen tener complicaciones graves. La vacuna Td se [place] para proteger a los adolescentes y a los adultos de ambas enfermedades. Tanto el ttanos como la difteria son infecciones causadas por bacterias. La difteria se transmite de persona a persona a travs de la tos o el estornudo. Las bacterias que causan el ttanos entran en el cuerpo a travs de cortes, raspones o heridas. El TTANOS (trismo) provoca entumecimiento y Engineer, materials dolorosa de los msculos, por lo  general, en todo el cuerpo.  Puede causar el endurecimiento de los msculos de la cabeza y el cuello, de modo que impide abrir la boca, tragar y, en algunos casos, incluso respirar. El ttanos es causa de muerte en aproximadamente 1de cada 10personas que contraen la infeccin, incluso despus de que reciben la mejor atencin mdica. La DIFTERIA puede hacer que se forme una membrana gruesa en la parte posterior de la garganta.  Puede causar problemas respiratorios, parlisis, insuficiencia cardaca e incluso la muerte. Antes de las vacunas, en los [place] se informaban 200000 casos de difteria y cientos de casos de ttanos cada ao. Desde que comenz la vacunacin, los informes de casos de ambas enfermedades se han reducido en un 99%. 2. Madilyn Fireman Td La vacuna Td puede proteger a adolescentes y adultos contra el ttanos y la difteria. La vacuna Td habitualmente se aplica como dosis de refuerzo cada 10aos, pero tambin puede administrarse antes si la persona sufre una [place] o herida sucia y grave. A veces, en lugar de la vacuna Td, se recomienda una vacuna llamada Tdap, que protege contra la tos [place], adems de proteger contra el ttanos y la difteria. El mdico o la persona que le aplique la vacuna puede darle ms informacin al [employer]. La vacuna  Td puede administrarse de manera segura simultneamente con otras vacunas. 3. Algunas personas no deben recibir esta vacuna  Una persona que alguna vez ha tenido una reaccin alrgica potencialmente mortal a una dosis anterior de cualquier vacuna contra el ttanos o la difteria, O que tenga una alergia grave a cualquier parte de esta vacuna, no debe recibir la vacuna Td. Informe a la persona que le aplica la vacuna si tiene cualquier alergia grave.  Consulte con su mdico si: ? tuvo hinchazn o dolor intenso despus de recibir cualquier vacuna contra la difteria o el ttanos, ? alguna vez ha sufrido el sndrome de Pension scheme manager  (SGB), ? no se siente Research scientist (life sciences) en que se ha programado la vacuna. 4. Riesgos de [place] reaccin a la vacuna Con cualquier medicamento, incluso las vacunas, existe la posibilidad de que aparezcan efectos secundarios. Suelen ser leves y desaparecen por s solos. Si bien es posible tener [place] graves, estas son Lynnae Sandhoff raras. La [employer]son de las personas a las que se les aplica la vacuna Td no tienen ningn problema. Problemas leves despus de la vacuna Td: (No interfirieron en las actividades)  Art therapist donde se aplic la vacuna (alrededor de 8de cada 10personas)  Enrojecimiento o Paramedic donde se aplic la vacuna (alrededor de 1de cada 4personas)  Fiebre leve (poco frecuente)  Dolor de Training and development officer (alrededor de 1de cada 4personas)  Cansancio (alrededor de 1de cada 4personas) Problemas moderados despus de la vacuna Td: (Interfirieron en las [place], pero no exigieron atencin [place])  Teacher, English as a foreign language superior a 102F (39C) (poco frecuente) Problemas graves despus de la vacuna Td: (Impidieron Education officer, environmental las actividades habituales y exigieron atencin mdica)  Buyer, retail, Engineer, mining intenso, sangrado o enrojecimiento en el brazo en que se aplic la vacuna (poco frecuente). Problemas que podran ocurrir despus de cualquier vacuna:  A veces, las personas se desmayan despus de un procedimiento mdico, incluida la vacunacin. Permanecer sentado o recostado durante puede ayudar a [employer] Corporation y las lesiones causadas por las cadas. Informe al mdico si se siente mareado, tiene cambios en la visin o zumbidos en los odos.  Algunas personas sienten un dolor intenso en el hombro y tienen dificultad para mover el brazo donde se aplic la vacuna. Esto sucede con muy poca frecuencia.  Cualquier medicamento puede causar una reaccin alrgica grave. Dichas reacciones son Lynnae Sandhoff poco frecuentes con una vacuna (se calcula que menos de 1en un milln de dosis) y se  producen entre unos minutos y unas horas despus de la vacunacin. Al igual que con cualquier [employer] Data, existe una probabilidad muy remota de que una vacuna cause una lesin grave o la [place]. La seguridad de las vacunas se controla permanentemente. Para obtener ms informacin, visite: http://floyd.org/ 5. Qu pasa si hay una reaccin grave? A qu signos debo estar atento?  Est atento a la aparicin de signos preocupantes, como los de una reaccin alrgica grave, fiebre muy alta o comportamiento fuera de lo normal. Los signos de una reaccin alrgica grave pueden incluir ronchas, hinchazn de la cara y la garganta, dificultad para respirar, latidos cardacos acelerados, mareos y debilidad. Generalmente, estos comienzan entre unos pocos minutos y algunas horas despus de la vacunacin. Qu debo hacer?  Si usted piensa que se trata de una reaccin alrgica grave o de otra emergencia que no puede esperar, llame al 9-1-1 o dirjase al hospital ms cercano. De lo contrario, llame al mdico.  Despus, la reaccin debe informarse al Cisco de Informe de [employer]  Adversos de [place] (Vaccine Adverse Event Reporting System, VAERS). El mdico puede presentar este informe, o bien puede hacerlo usted mismo a travs del sitio web de VAERS, en www.vaers.LAgents.no, o llamando al [phone]. El [place] de Informe de Eventos Adversos de [place] no brinda recomendaciones mdicas. 6. SunTrust de Compensacin de Daos por American Electric Power El [place] de Compensacin de Daos por Administrator, arts (National Vaccine Injury Compensation Program, VICP) es un programa federal que fue creado para Patent examiner a las personas que puedan haber sufrido daos al recibir ciertas vacunas. Aquellas personas que consideren que han sufrido un dao como consecuencia de una vacuna y [place] saber ms acerca del programa y de cmo presentar un reclamo, pueden llamar al [phone] o visitar el sitio web del VICP en  SpiritualWord.at. Hay un lmite de tiempo para presentar un reclamo de compensacin. 7. Cmo puedo obtener ms informacin?  Consulte a su mdico. Este puede darle el prospecto de la vacuna o recomendarle otras fuentes de informacin.  Comunquese con el servicio de salud de su localidad o [address].  Comunquese con Building control surveyor for [employer] and Prevention, CDC (Centros para el Control y la Prevencin de [place]): ? Llame al 802-013-8076 (1-800-CDC-INFO) ? Visite el sitio Environmental manager en PicCapture.uy Declaracin de informacin sobre la vacuna Td (05/30/2016) Esta informacin no tiene Theme park manager el consejo del mdico. Asegrese de hacerle al mdico cualquier pregunta que tenga. Document Released: 10/29/2008 Document Revised: 03/16/2018 Document Reviewed: 03/16/2018 Elsevier Interactive Patient Education  2019 Elsevier Inc.    Influenza Virus Vaccine injection (Fluarix) Qu es este medicamento? La VACUNA ANTIGRIPAL ayuda a disminuir el riesgo de contraer la influenza, tambin conocida como la gripe. La vacuna solo ayuda a protegerle contra algunas cepas de influenza. Esta vacuna no ayuda a reducir Nurse, adult de contraer influenza pandmica H1N1. Este medicamento puede ser utilizado para otros usos; si tiene alguna pregunta consulte con su proveedor de atencin mdica o con su farmacutico. MARCAS COMUNES: Fluarix, Fluzone Qu le debo informar a mi profesional de la salud antes de tomar este medicamento? Necesita saber si usted presenta alguno de los siguientes problemas o situaciones: -trastorno de sangrado como hemofilia -fiebre o infeccin -sndrome de Guillain-Barre u otros problemas neurolgicos -problemas del sistema inmunolgico -infeccin por el virus de la inmunodeficiencia humana (VIH) o SIDA -niveles bajos de plaquetas en la sangre -esclerosis mltiple -una Automotive engineer o inusual a las vacunas antigripales, a los huevos,  protenas de pollo, al ltex, a la gentamicina, a otros medicamentos, alimentos, colorantes o conservantes -si est embarazada o buscando quedar embarazada -si est amamantando a un beb Cmo debo utilizar este medicamento? Esta vacuna se administra mediante inyeccin por va intramuscular. Lo administra un profesional de [employer]. Recibir una copia de informacin escrita sobre la vacuna antes de cada vacuna. Asegrese de leer este folleto cada vez cuidadosamente. Este folleto puede cambiar con frecuencia. Hable con su pediatra para informarse acerca del uso de este medicamento en nios. Puede requerir atencin especial. Sobredosis: Pngase en contacto inmediatamente con un centro toxicolgico o una sala de urgencia si usted cree que haya tomado demasiado medicamento. ATENCIN: Reynolds American es solo para usted. No comparta este medicamento con nadie. Qu sucede si me olvido de una dosis? No se aplica en este caso. Qu puede interactuar con este medicamento? -quimioterapia o radioterapia -medicamentos que suprimen el sistema inmunolgico, tales como etanercept, anakinra, infliximab y adalimumab -medicamentos que tratan o previenen cogulos sanguneos, como warfarina -fenitona -medicamentos esteroideos, como la  prednisona o la cortisona -teofilina -vacunas Puede ser que esta lista no menciona todas las posibles interacciones. Informe a su profesional de la salud de In[employer] Inc productos a base de hierbas, medicamentos de [place] LakeBeazer Homesplementos nutritivos que est tomando. Si usted fuma, consume bebidas alcohlicas o si utiliza drogas ilegales, indqueselo tambin a su profesional de [employer]. Algunas sustancias pueden interactuar con su medicamento. A qu debo estar atento al usar PPL Corporation? Informe a su mdico o a Producer, television/film/video de la Dollar General todos los efectos secundarios que persistan despus de [address]. Llame a su proveedor de atencin mdica si se presentan sntomas  inusuales dentro de las 6 semanas posteriores a la vacunacin. Es posible que todava pueda contraer la gripe, pero la enfermedad no ser tan fuerte como normalmente. No puede contraer la gripe de esta vacuna. La vacuna antigripal no le protege contra resfros u otras enfermedades que pueden causar [place]. Debe vacunarse cada ao. Qu efectos secundarios puedo tener al Boston Scientific este medicamento? Efectos secundarios que debe informar a su mdico o a Producer, television/film/video de la salud tan pronto como sea posible: -reacciones alrgicas como erupcin cutnea, picazn o urticarias, hinchazn de la cara, labios o lengua Efectos secundarios que, por lo general, no requieren atencin mdica (debe informarlos a su mdico o a su profesional de la salud si persisten o si son molestos): -fiebre -dolor de cabeza -molestias y dolores musculares -dolor, sensibilidad, enrojecimiento o Paramedic de la inyeccin -cansancio o debilidad Puede ser que esta lista no menciona todos los posibles efectos secundarios. Comunquese a su mdico por asesoramiento mdico [employer]lett[employer]ard. Usted puede informar los efectos secundarios a la FDA por telfono al 1-800-FDA-1088. Dnde debo guardar mi medicina? Esta vacuna se administra solamente en clnicas, farmacias, consultorio mdico u otro consultorio de un profesional de la salud y no Teacher, early years/pre en su domicilio. ATENCIN: Este folleto es un resumen. Puede ser que no cubra toda la posible informacin. Si usted tiene preguntas acerca de esta medicina, consulte con su mdico, su farmacutico o su profesional de Radiographer, therapeutic.  2019 Elsevier/Gold Standard (2010-01-14 15:31:40)

## 2018-11-08 ENCOUNTER — Ambulatory Visit: Payer: Self-pay | Attending: Internal Medicine | Admitting: Internal Medicine

## 2018-11-08 ENCOUNTER — Other Ambulatory Visit: Payer: Self-pay

## 2019-05-08 ENCOUNTER — Encounter: Payer: Self-pay | Admitting: Critical Care Medicine

## 2019-05-08 ENCOUNTER — Ambulatory Visit: Payer: Self-pay | Attending: Critical Care Medicine | Admitting: Critical Care Medicine

## 2019-05-08 ENCOUNTER — Other Ambulatory Visit: Payer: Self-pay

## 2019-05-08 DIAGNOSIS — J029 Acute pharyngitis, unspecified: Secondary | ICD-10-CM

## 2019-05-08 DIAGNOSIS — M791 Myalgia, unspecified site: Secondary | ICD-10-CM

## 2019-05-08 DIAGNOSIS — Z20822 Contact with and (suspected) exposure to covid-19: Secondary | ICD-10-CM | POA: Insufficient documentation

## 2019-05-08 DIAGNOSIS — R509 Fever, unspecified: Secondary | ICD-10-CM

## 2019-05-08 DIAGNOSIS — Z20828 Contact with and (suspected) exposure to other viral communicable diseases: Secondary | ICD-10-CM

## 2019-05-08 DIAGNOSIS — R05 Cough: Secondary | ICD-10-CM

## 2019-05-08 NOTE — Progress Notes (Signed)
Patient has been called and DOB has been verified. Patient has been screened and transferred to PCP to start phone visit.   Patient is needing to be evaluated for Covid 19.  Patient is having a sore throat fever and muscle aches.

## 2019-05-08 NOTE — Progress Notes (Signed)
Patient ID: Marc Chen, male   DOB: 1980-08-08, 38 y.o.   MRN: [id number] Virtual Visit via Telephone Note  I connected with Suzanne Annett Gula on 05/08/19 at  1:30 PM EDT by telephone and verified that I am speaking with the correct person using two identifiers.   Consent:  I discussed the limitations, risks, security and privacy concerns of performing an evaluation and management service by telephone and the availability of in person appointments. I also discussed with the patient that there may be a patient responsible charge related to this service. The patient expressed understanding and agreed to proceed.  Location of patient: The patient was at home  Location of provider: I was in my office  Persons participating in the televisit with the patient.   No one else was on the call however the Spanish interpreter Marc Chen [phone] was on the call with me    History of Present Illness: This is a 38 year old Latino male who has had the onset since October 9 of sore throat diffuse body aches significant fatigue low-grade fever stuffiness in the nose slight cough and loss of sense of smell.  The patient just had a COVID test done locally results which are pending for the next 24 hours.  The patient denies headache dizziness gastrointestinal symptoms alterations in mentation or significant high fever.  Patient does not have shortness of breath or cough.  He is unclear as to his exposure.  He has been self isolating since 9 October.  Prior history documented with previous clinic visit in [facility] by Dr. Laural Benes included pre-hypertension along with carpal tunnel syndrome symptoms.  Pos in BOLD Constitutional:   No  weight loss, night sweats,  Fevers, chills, fatigue, lassitude. HEENT:   No headaches,  Difficulty swallowing,  Tooth/dental problems,  Sore throat,                No sneezing, itching, ear ache, nasal congestion, post nasal drip,   CV:   No chest pain,  Orthopnea, PND, swelling in lower extremities, anasarca, dizziness, palpitations  GI  No heartburn, indigestion, abdominal pain, nausea, vomiting, diarrhea, change in bowel habits, loss of appetite  Resp: No shortness of breath with exertion or at rest.  No excess mucus, no productive cough,   non-productive cough,  No coughing up of blood.  No change in color of mucus.  No wheezing.  No chest wall deformity  Skin: no rash or lesions.  GU: no dysuria, change in color of urine, no urgency or frequency.  No flank pain.  MS:  No joint pain or swelling.  No decreased range of motion.  No back pain.  Muscle Aches  Psych:  No change in mood or affect. No depression or anxiety.  No memory loss.    Observations/Objective: No observations as this was a telephone visit  Assessment and Plan: #1 likely COVID infection in this patient who has a COVID test pending  In addition to continuing self-isolation I recommend the patient begin zinc 50 mg daily, vitamin C 500 mg daily, vitamin D 1000 units daily.  Patient is to call back with his COVID test result  I will schedule an in office exam the week of 26 October  If the patient worsens he is to go to the urgent care or emergency room  Follow Up Instructions: The patient will have a follow-up exam in office the week of October 26 and he is been instructed to  go to the emergency room or urgent care if his symptoms worsen he is also to call back with his COVID test result   I discussed the assessment and treatment plan with the patient. The patient was provided an opportunity to ask questions and all were answered. The patient agreed with the plan and demonstrated an understanding of the instructions.   The patient was advised to call back or seek an in-person evaluation if the symptoms worsen or if the condition fails to improve as anticipated.  I provided 25 minutes of non-face-to-face time during this encounter  including  median  intraservice time , review of notes, labs, imaging, medications  and explaining diagnosis and management to the patient .    Asencion Noble, MD

## 2019-05-23 ENCOUNTER — Other Ambulatory Visit: Payer: Self-pay | Admitting: Critical Care Medicine

## 2019-05-23 ENCOUNTER — Ambulatory Visit: Payer: Self-pay | Attending: Critical Care Medicine | Admitting: Critical Care Medicine

## 2019-05-23 ENCOUNTER — Other Ambulatory Visit: Payer: Self-pay

## 2019-05-23 ENCOUNTER — Encounter: Payer: Self-pay | Admitting: Critical Care Medicine

## 2019-05-23 DIAGNOSIS — U071 COVID-19: Secondary | ICD-10-CM | POA: Insufficient documentation

## 2019-05-23 DIAGNOSIS — Z20822 Contact with and (suspected) exposure to covid-19: Secondary | ICD-10-CM

## 2019-05-23 DIAGNOSIS — Z20828 Contact with and (suspected) exposure to other viral communicable diseases: Secondary | ICD-10-CM

## 2019-05-23 NOTE — Progress Notes (Signed)
Subjective:    Patient ID: Marc Chen, male    DOB: 12/12/1980, 38 y.o.   MRN: [id number] Virtual Visit via Telephone Note  I connected with Marc Chen on 05/23/19 at  3:00 PM EDT by telephone and verified that I am speaking with the correct person using two identifiers.   Consent:  I discussed the limitations, risks, security and privacy concerns of performing an evaluation and management service by telephone and the availability of in person appointments. I also discussed with the patient that there may be a patient responsible charge related to this service. The patient expressed understanding and agreed to proceed.  Location of patient: The patient was in his car here at the clinic Location of provider: I was in my office  Persons participating in the televisit with the patient.   No one else on the call    History of Present Illness: Last connected by telephone 05/08/19: This is a 38 year old Latino male who has had the onset since October 9 of sore throat diffuse body aches significant fatigue low-grade fever stuffiness in the nose slight cough and loss of sense of smell.  The patient just had a COVID test done locally results which are pending for the next 24 hours.  The patient denies headache dizziness gastrointestinal symptoms alterations in mentation or significant high fever.  Patient does not have shortness of breath or cough.  He is unclear as to his exposure.  He has been self isolating since 9 October.  Prior history documented with previous clinic [facility] by Dr. Laural BenesJohnson included pre-hypertension along with carpal tunnel syndrome symptoms.    05/23/2019 This is a 38 year old Latino male who had the onset of October 9 of sore throat diffuse body aches fatigue low-grade fever nasal stuffiness cough loss of sense of smell.  The patient had a Covid test done on or around 11 October at another site and we do  not have the results with the patient reports that was positive for Covid.  I had arranged for the patient to come in for an office exam today with a telephone visit on October 12 however when the patient arrived to the front desk on screening he screened positive with significant muscle aches and fatigue and therefore he was not allowed to enter the building and I then turned this into a telemedicine visit with him taking the visit from his phone in his car in the parking lot of the clinic  He tells me that he is still having achiness and fatigue but his cough is actually somewhat resolved.  He is no longer having fever.  He is not having significant respiratory symptoms.  He is inquiring about a repeat Covid test.     Past Medical History:  Diagnosis Date  . Hyperlipidemia   . Varicose veins      Family History  Problem Relation Age of Onset  . Diabetes Father      Social History   Socioeconomic History  . Marital status: Married    Spouse name: Not on file  . Number of children: Not on file  . Years of education: 9 grade  . Highest education level: Not on file  Occupational History  . Not on file  Social Needs  . Financial resource strain: Not on file  . Food insecurity    Worry: Not on file    Inability: Not on file  . Transportation needs  Medical: Not on file    Non-medical: Not on file  Tobacco Use  . Smoking status: Never Smoker  . Smokeless tobacco: Never Used  Substance and Sexual Activity  . Alcohol use: Yes    Alcohol/week: 1.0 standard drinks    Types: 1 Cans of beer per week  . Drug use: No  . Sexual activity: Yes  Lifestyle  . Physical activity    Days per week: Not on file    Minutes per session: Not on file  . Stress: Not on file  Relationships  . Social Musician on phone: Not on file    Gets together: Not on file    Attends religious service: Not on file    Active member of club or organization: Not on file    Attends meetings  of clubs or organizations: Not on file    Relationship status: Not on file  . Intimate partner violence    Fear of current or ex partner: Not on file    Emotionally abused: Not on file    Physically abused: Not on file    Forced sexual activity: Not on file  Other Topics Concern  . Not on file  Social History Narrative  . Not on file     No Known Allergies   Outpatient Medications Prior to Visit  Medication Sig Dispense Refill  . Lidocaine HCl 4 % CREA Aspercreme (lidocaine HCl) 4 % topical  Apply 1 application twice a day by topical route.     No facility-administered medications prior to visit.      Review of Systems Constitutional:    weight loss, night sweats,  Fevers, chills, fatigue, lassitude. HEENT:   No headaches,  Difficulty swallowing,  Tooth/dental problems,  Sore throat,                No sneezing, itching, ear ache, nasal congestion, post nasal drip,   CV:  No chest pain,  Orthopnea, PND, swelling in lower extremities, anasarca, dizziness, palpitations  GI  No heartburn, indigestion, abdominal pain, nausea, vomiting, diarrhea, change in bowel habits, loss of appetite  Resp: No shortness of breath with exertion or at rest.  No excess mucus, no productive cough,  No non-productive cough,  No coughing up of blood.  No change in color of mucus.  No wheezing.  No chest wall deformity  Skin: no rash or lesions.  GU: no dysuria, change in color of urine, no urgency or frequency.  No flank pain.  MS:  No joint pain or swelling.  No decreased range of motion.  No back pain. Muscle aches  Psych:  No change in mood or affect. No depression or anxiety.  No memory loss.  No observations as this was a telephone visit      Assessment & Plan:  I personally reviewed all images and lab data in the [facility] system as well as any outside material available during this office visit and agree with the  radiology impressions.   COVID-19 virus infection This patient clearly had  COVID-19 infection with a positive test on 11 October and is still having symptoms at this date on October 27.  We elected to repeat testing today at the clinic.  I asked the patient to continue his vitamin supplements and at this point we cannot bring him back into the clinic space until his symptoms have more completely resolved.  We will see the results of the Covid test and depending on that  we will schedule an in office exam at a later date   Diagnoses and all orders for this visit:  COVID-19 virus infection  Encounter by telehealth for suspected COVID-19 -     Novel Coronavirus, NAA ([facility])   Follow Up Instructions:    I discussed the assessment and treatment plan with the patient. The patient was provided an opportunity to ask questions and all were answered. The patient agreed with the plan and demonstrated an understanding of the instructions.   The patient was advised to call back or seek an in-person evaluation if the symptoms worsen or if the condition fails to improve as anticipated.  I provided 30 minutes of non-face-to-face time during this encounter  including  median intraservice time , review of notes, labs, imaging, medications  and explaining diagnosis and management to the patient .    Asencion Noble, MD

## 2019-05-23 NOTE — Assessment & Plan Note (Signed)
This patient clearly had COVID-19 infection with a positive test on 11 October and is still having symptoms at this date on October 27.  We elected to repeat testing today at the clinic.  I asked the patient to continue his vitamin supplements and at this point we cannot bring him back into the clinic space until his symptoms have more completely resolved.  We will see the results of the Covid test and depending on that we will schedule an in office exam at a later date

## 2019-05-26 LAB — NOVEL CORONAVIRUS, NAA: SARS-CoV-2, NAA: NOT DETECTED

## 2020-10-25 ENCOUNTER — Ambulatory Visit: Payer: Self-pay

## 2024-02-23 ENCOUNTER — Ambulatory Visit (INDEPENDENT_AMBULATORY_CARE_PROVIDER_SITE_OTHER): Payer: Self-pay | Admitting: Family

## 2024-02-23 ENCOUNTER — Encounter: Payer: Self-pay | Admitting: Family

## 2024-02-23 VITALS — BP 123/85 | HR 86 | Temp 98.4°F | Resp 16 | Ht 66.0 in | Wt 203.6 lb

## 2024-02-23 DIAGNOSIS — M25571 Pain in right ankle and joints of right foot: Secondary | ICD-10-CM

## 2024-02-23 DIAGNOSIS — Z7689 Persons encountering health services in other specified circumstances: Secondary | ICD-10-CM

## 2024-02-23 DIAGNOSIS — Z603 Acculturation difficulty: Secondary | ICD-10-CM

## 2024-02-23 DIAGNOSIS — M79605 Pain in left leg: Secondary | ICD-10-CM

## 2024-02-23 DIAGNOSIS — Z758 Other problems related to medical facilities and other health care: Secondary | ICD-10-CM

## 2024-02-23 MED ORDER — MELOXICAM 7.5 MG PO TABS: 7.5000 mg | ORAL_TABLET | Freq: Every day | ORAL | 0 refills | Status: AC

## 2024-02-23 NOTE — Progress Notes (Signed)
 Strong pain on the back of his left leg, right ankle pain

## 2024-02-23 NOTE — Progress Notes (Signed)
 Subjective:    Marc Chen - 43 y.o. male MRN [id number]  Date of birth: 03-31-1981  HPI  Marc Chen is to establish care.   Current issues and/or concerns: - Left lower leg pain for 8 days. Denies recent trauma/injury and red flag symptoms.  - Right ankle pain for several days. Denies recent trauma/injury and red flag symptoms. Taking over-the-counter medication with minimal relief.  ROS per HPI    Health Maintenance:  Health Maintenance Due  Topic Date Due   HIV Screening  Never done   Hepatitis C Screening  Never done     Past Medical History: Patient Active Problem List   Diagnosis Date Noted   COVID-19 virus infection 05/23/2019   Bilateral carpal tunnel syndrome 09/23/2018   Prehypertension 09/23/2018   Varicose veins of left lower extremity with complications 09/10/2015      Social History   reports that he has never smoked. He has never used smokeless tobacco. He reports current alcohol use of about 1.0 standard drink of alcohol per week. He reports that he does not use drugs.   Family History  family history includes Diabetes in his father.   Medications: reviewed and updated   Objective:   Physical Exam BP 123/85   Pulse 86   Temp 98.4 F (36.9 C) (Oral)   Resp 16   Ht 5' 6 (1.676 m)   Wt 203 lb 9.6 oz (92.4 kg)   SpO2 96%   BMI 32.86 kg/m   Physical Exam HENT:     Head: Normocephalic and atraumatic.     Nose: Nose normal.     Mouth/Throat:     Mouth: Mucous membranes are moist.     Pharynx: Oropharynx is clear.  Eyes:     Extraocular Movements: Extraocular movements intact.     Conjunctiva/sclera: Conjunctivae normal.     Pupils: Pupils are equal, round, and reactive to light.  Cardiovascular:     Rate and Rhythm: Normal rate and regular rhythm.     Pulses: Normal pulses.     Heart sounds: Normal heart sounds.  Pulmonary:     Effort: Pulmonary effort is normal.     Breath sounds: Normal breath sounds.   Musculoskeletal:        General: Normal range of motion.     Cervical back: Normal range of motion and neck supple.     Right hip: Normal.     Left hip: Normal.     Right upper leg: Normal.     Left upper leg: Normal.     Right knee: Normal.     Left knee: Normal.     Right lower leg: Normal.     Left lower leg: Normal.     Right ankle: Normal.     Left ankle: Normal.     Right foot: Normal.     Left foot: Normal.  Neurological:     General: No focal deficit present.     Mental Status: He is alert and oriented to person, place, and time.  Psychiatric:        Mood and Affect: Mood normal.        Behavior: Behavior normal.       Assessment & Plan:  1. Encounter to establish care (Primary) - Patient presents today to establish care. During the interim follow-up with primary provider as scheduled.  - Return for annual physical examination, labs, and health maintenance. Arrive fasting meaning having no food for at least 8  hours prior to appointment. You may have only water or black coffee. Please take scheduled medications as normal.  2. Left leg pain - Meloxicam  as prescribed. Counseled on medication adherence/adverse effects.  - Vascular US  Lower Extremity Venous for evaluation.  - Follow-up with primary provider as scheduled.  - VAS US  LOWER EXTREMITY VENOUS (DVT); Future - meloxicam  (MOBIC ) 7.5 MG tablet; Take 1 tablet (7.5 mg total) by mouth daily.  Dispense: 90 tablet; Refill: 0  3. Right ankle pain, unspecified chronicity - Meloxicam  as prescribed. Counseled on medication adherence/adverse effects.  - Follow-up with primary provider as scheduled.  - meloxicam  (MOBIC ) 7.5 MG tablet; Take 1 tablet (7.5 mg total) by mouth daily.  Dispense: 90 tablet; Refill: 0  4. Language barrier - [place] in-person interpreter, [place]    Patient was given clear instructions to go to Emergency Department or return to medical center if symptoms don't improve, worsen, or new  problems develop.The patient verbalized understanding.  I discussed the assessment and treatment plan with the patient. The patient was provided an opportunity to ask questions and all were answered. The patient agreed with the plan and demonstrated an understanding of the instructions.   The patient was advised to call back or seek an in-person evaluation if the symptoms worsen or if the condition fails to improve as anticipated.    Greig Drones, NP 02/23/2024, 4:56 PM Primary Care at [facility]

## 2024-02-28 ENCOUNTER — Ambulatory Visit (HOSPITAL_COMMUNITY)
Admission: RE | Admit: 2024-02-28 | Discharge: 2024-02-28 | Disposition: A | Discharge status: Home / Self Care | Payer: Self-pay | Source: Ambulatory Visit | Attending: Surgery | Admitting: Surgery

## 2024-02-28 ENCOUNTER — Ambulatory Visit: Payer: Self-pay | Admitting: Family

## 2024-02-28 DIAGNOSIS — M79605 Pain in left leg: Secondary | ICD-10-CM | POA: Insufficient documentation

## 2024-04-04 ENCOUNTER — Encounter: Payer: Self-pay | Admitting: Family

## 2024-04-04 ENCOUNTER — Ambulatory Visit (INDEPENDENT_AMBULATORY_CARE_PROVIDER_SITE_OTHER): Payer: Self-pay | Admitting: Family

## 2024-04-04 VITALS — BP 150/97 | HR 63 | Temp 97.4°F | Resp 16 | Ht 66.0 in | Wt 199.6 lb

## 2024-04-04 DIAGNOSIS — Z0000 Encounter for general adult medical examination without abnormal findings: Secondary | ICD-10-CM

## 2024-04-04 DIAGNOSIS — Z130 Encounter for screening for diseases of the blood and blood-forming organs and certain disorders involving the immune mechanism: Secondary | ICD-10-CM

## 2024-04-04 DIAGNOSIS — Z0130 Encounter for examination of blood pressure without abnormal findings: Secondary | ICD-10-CM

## 2024-04-04 DIAGNOSIS — Z131 Encounter for screening for diabetes mellitus: Secondary | ICD-10-CM

## 2024-04-04 DIAGNOSIS — Z114 Encounter for screening for human immunodeficiency virus [HIV]: Secondary | ICD-10-CM

## 2024-04-04 DIAGNOSIS — Z13228 Encounter for screening for other metabolic disorders: Secondary | ICD-10-CM

## 2024-04-04 DIAGNOSIS — Z1329 Encounter for screening for other suspected endocrine disorder: Secondary | ICD-10-CM

## 2024-04-04 DIAGNOSIS — Z758 Other problems related to medical facilities and other health care: Secondary | ICD-10-CM

## 2024-04-04 DIAGNOSIS — Z1159 Encounter for screening for other viral diseases: Secondary | ICD-10-CM

## 2024-04-04 DIAGNOSIS — Z13220 Encounter for screening for lipoid disorders: Secondary | ICD-10-CM

## 2024-04-04 NOTE — Progress Notes (Signed)
 Patient ID: Marc Chen, male    DOB: Dec 30, 1980  MRN: [id number]  CC: Annual Exam  Subjective: Marc Chen is a 43 y.o. male who presents for annual exam.   His concerns today include:  - Blood pressure check. He limits salt intake. He does not exercise outside of his normal routine. He does not complain of red flag symptoms such as but not limited to chest pain, shortness of breath, worst headache of life, nausea/vomiting.   Patient Active Problem List   Diagnosis Date Noted   COVID-19 virus infection 05/23/2019   Bilateral carpal tunnel syndrome 09/23/2018   Prehypertension 09/23/2018   Varicose veins of left lower extremity with complications 09/10/2015     Current Outpatient Medications on File Prior to Visit  Medication Sig Dispense Refill   Lidocaine HCl 4 % CREA Aspercreme (lidocaine HCl) 4 % topical  Apply 1 application twice a day by topical route. (Patient not taking: Reported on 02/23/2024)     meloxicam  (MOBIC ) 7.5 MG tablet Take 1 tablet (7.5 mg total) by mouth daily. (Patient not taking: Reported on 04/04/2024) 90 tablet 0   No current facility-administered medications on file prior to visit.    No Known Allergies  Social History   Socioeconomic History   Marital status: Significant Other    Spouse name: Not on file   Number of children: Not on file   Years of education: 9 grade   Highest education level: Not on file  Occupational History   Not on file  Tobacco Use   Smoking status: Never   Smokeless tobacco: Never  Vaping Use   Vaping status: Never Used  Substance and Sexual Activity   Alcohol use: Yes    Alcohol/week: 1.0 standard drink of alcohol    Types: 1 Cans of beer per week   Drug use: No   Sexual activity: Yes  Other Topics Concern   Not on file  Social History Narrative   Not on file   Social Drivers of Health   Financial Resource Strain: Low Risk  (02/23/2024)   Overall Financial Resource Strain (CARDIA)     Difficulty of Paying Living Expenses: Not hard at all  Food Insecurity: No Food Insecurity (02/23/2024)   Hunger Vital Sign    Worried About Running Out of Food in the Last Year: Never true    Ran Out of Food in the Last Year: Never true  Transportation Needs: No Transportation Needs (02/23/2024)   PRAPARE - Administrator, Civil Service (Medical): No    Lack of Transportation (Non-Medical): No  Physical Activity: Inactive (02/23/2024)   Exercise Vital Sign    Days of Exercise per Week: 0 days    Minutes of Exercise per Session: 0 min  Stress: No Stress Concern Present (02/23/2024)   [employer] of Occupational Health - Occupational Stress Questionnaire    Feeling of Stress: Only a little  Social Connections: Moderately Integrated (02/23/2024)   Social Connection and Isolation Panel    Frequency of Communication with Friends and Family: More than three times a week    Frequency of Social Gatherings with Friends and Family: More than three times a week    Attends Religious Services: More than 4 times per year    Active Member of [employer] or Organizations: No    Attends Banker Meetings: Never    Marital Status: Living with partner  Intimate Partner Violence: Not At Risk (02/23/2024)   Humiliation, Afraid,  Rape, and Kick questionnaire    Fear of Current or Ex-Partner: No    Emotionally Abused: No    Physically Abused: No    Sexually Abused: No    Family History  Problem Relation Age of Onset   Diabetes Father     Past Surgical History:  Procedure Laterality Date   LASER ABLATION Left    MENISCECTOMY Left     ROS: Review of Systems Negative except as stated above  PHYSICAL EXAM: BP (!) 150/97   Pulse 63   Temp (!) 97.4 F (36.3 C) (Oral)   Resp 16   Ht 5' 6 (1.676 m)   Wt 199 lb 9.6 oz (90.5 kg)   SpO2 96%   BMI 32.22 kg/m      04/04/2024    9:18 AM 04/04/2024    8:52 AM 02/23/2024    3:21 PM  Vitals with BMI  Height  5' 6 5' 6  Weight   199 lbs 10 oz 203 lbs 10 oz  BMI  32.23 32.88  Systolic 150 133 876  Diastolic 97 91 85  Pulse  63 86   Physical Exam HENT:     Head: Normocephalic and atraumatic.     Right Ear: Tympanic membrane, ear canal and external ear normal.     Left Ear: Tympanic membrane, ear canal and external ear normal.     Nose: Nose normal.     Mouth/Throat:     Mouth: Mucous membranes are moist.     Pharynx: Oropharynx is clear.  Eyes:     Extraocular Movements: Extraocular movements intact.     Conjunctiva/sclera: Conjunctivae normal.     Pupils: Pupils are equal, round, and reactive to light.  Neck:     Thyroid: No thyroid mass, thyromegaly or thyroid tenderness.  Cardiovascular:     Rate and Rhythm: Normal rate and regular rhythm.     Pulses: Normal pulses.     Heart sounds: Normal heart sounds.  Pulmonary:     Effort: Pulmonary effort is normal.     Breath sounds: Normal breath sounds.  Abdominal:     General: Bowel sounds are normal.     Palpations: Abdomen is soft.  Genitourinary:    Comments: Patient declined. Musculoskeletal:        General: Normal range of motion.     Right shoulder: Normal.     Left shoulder: Normal.     Right upper arm: Normal.     Left upper arm: Normal.     Right elbow: Normal.     Left elbow: Normal.     Right forearm: Normal.     Left forearm: Normal.     Right wrist: Normal.     Left wrist: Normal.     Right hand: Normal.     Left hand: Normal.     Cervical back: Normal, normal range of motion and neck supple.     Thoracic back: Normal.     Lumbar back: Normal.     Right hip: Normal.     Left hip: Normal.     Right upper leg: Normal.     Left upper leg: Normal.     Right knee: Normal.     Left knee: Normal.     Right lower leg: Normal.     Left lower leg: Normal.     Right ankle: Normal.     Left ankle: Normal.     Right foot: Normal.     Left foot:  Normal.  Skin:    General: Skin is warm and dry.     Capillary Refill: Capillary refill  takes less than 2 seconds.  Neurological:     General: No focal deficit present.     Mental Status: He is alert and oriented to person, place, and time.  Psychiatric:        Mood and Affect: Mood normal.        Behavior: Behavior normal.     ASSESSMENT AND PLAN: 1. Annual physical exam (Primary) - Counseled on 150 minutes of exercise per week as tolerated, healthy eating (including decreased daily intake of saturated fats, cholesterol, added sugars, sodium), STI prevention, and routine healthcare maintenance.  2. Screening for metabolic disorder - Routine screening.  - CMP14+EGFR  3. Screening for deficiency anemia - Routine screening.  - CBC  4. Diabetes mellitus screening - Routine screening.  - Hemoglobin A1c  5. Screening cholesterol level - Routine screening.  - Lipid panel  6. Thyroid disorder screen - Routine screening.  - TSH  7. Encounter for screening for HIV - Routine screening.  - HIV antibody (with reflex)  8. Need for hepatitis C screening test - Routine screening.  - Hepatitis C Antibody  9. Blood pressure check - Blood pressure not at goal during today's visit. Patient asymptomatic without chest pressure, chest pain, palpitations, shortness of breath, worst headache of life, and any additional red flag symptoms. - Follow-up with primary provider in 4 weeks or sooner if needed.   10. Language barrier - Patient speaking English.   Patient was given the opportunity to ask questions.  Patient verbalized understanding of the plan and was able to repeat key elements of the plan. Patient was given clear instructions to go to Emergency Department or return to medical center if symptoms don't improve, worsen, or new problems develop.The patient verbalized understanding.   Orders Placed This Encounter  Procedures   HIV antibody (with reflex)   Hepatitis C Antibody   CBC   Lipid panel   CMP14+EGFR   Hemoglobin A1c   TSH     Requested Prescriptions     No prescriptions requested or ordered in this encounter    Return in about 1 year (around 04/04/2025) for Physical per patient preference and 4 weeks blood pressure check.  Greig JINNY Drones, NP

## 2024-04-05 ENCOUNTER — Ambulatory Visit: Payer: Self-pay | Admitting: Family

## 2024-04-05 ENCOUNTER — Other Ambulatory Visit: Payer: Self-pay

## 2024-04-05 DIAGNOSIS — Z13228 Encounter for screening for other metabolic disorders: Secondary | ICD-10-CM

## 2024-04-05 DIAGNOSIS — E785 Hyperlipidemia, unspecified: Secondary | ICD-10-CM

## 2024-04-05 LAB — CBC
Hematocrit: 47.2 % (ref 37.5–51.0)
Hemoglobin: 15.6 g/dL (ref 13.0–17.7)
MCH: 29.8 pg (ref 26.6–33.0)
MCHC: 33.1 g/dL (ref 31.5–35.7)
MCV: 90 fL (ref 79–97)
Platelets: 264 x10E3/uL (ref 150–450)
RBC: 5.24 x10E6/uL (ref 4.14–5.80)
RDW: 12.6 % (ref 11.6–15.4)
WBC: 7.6 x10E3/uL (ref 3.4–10.8)

## 2024-04-05 LAB — LIPID PANEL
Chol/HDL Ratio: 5.3 ratio — ABNORMAL HIGH (ref 0.0–5.0)
Cholesterol, Total: 362 mg/dL — ABNORMAL HIGH (ref 100–199)
HDL: 68 mg/dL (ref >39)
LDL Chol Calc (NIH): 236 mg/dL — ABNORMAL HIGH (ref 0–99)
Triglycerides: 281 mg/dL — ABNORMAL HIGH (ref 0–149)
VLDL Cholesterol Cal: 58 mg/dL — ABNORMAL HIGH (ref 5–40)

## 2024-04-05 LAB — TSH: TSH: 0.907 u[IU]/mL (ref 0.450–4.500)

## 2024-04-05 LAB — CMP14+EGFR
ALT: 53 IU/L — ABNORMAL HIGH (ref 0–44)
AST: 30 IU/L (ref 0–40)
Albumin: 4.9 g/dL (ref 4.1–5.1)
Alkaline Phosphatase: 90 IU/L (ref 44–121)
BUN/Creatinine Ratio: 21 — ABNORMAL HIGH (ref 9–20)
BUN: 17 mg/dL (ref 6–24)
Bilirubin Total: 0.4 mg/dL (ref 0.0–1.2)
CO2: 22 mmol/L (ref 20–29)
Calcium: 10.1 mg/dL (ref 8.7–10.2)
Chloride: 100 mmol/L (ref 96–106)
Creatinine, Ser: 0.8 mg/dL (ref 0.76–1.27)
Globulin, Total: 3 g/dL (ref 1.5–4.5)
Glucose: 70 mg/dL (ref 70–99)
Potassium: 4.3 mmol/L (ref 3.5–5.2)
Sodium: 139 mmol/L (ref 134–144)
Total Protein: 7.9 g/dL (ref 6.0–8.5)
eGFR: 112 mL/min/1.73 (ref >59)

## 2024-04-05 LAB — HEMOGLOBIN A1C
Est. average glucose Bld gHb Est-mCnc: 120 mg/dL
Hgb A1c MFr Bld: 5.8 % — ABNORMAL HIGH (ref 4.8–5.6)

## 2024-04-05 LAB — HEPATITIS C ANTIBODY: Hep C Virus Ab: NONREACTIVE

## 2024-04-05 LAB — HIV ANTIBODY (ROUTINE TESTING W REFLEX): HIV Screen 4th Generation wRfx: NONREACTIVE

## 2024-04-05 MED ORDER — ATORVASTATIN CALCIUM 20 MG PO TABS
20.0000 mg | ORAL_TABLET | Freq: Every day | ORAL | 0 refills | Status: DC
  Filled 2024-04-05: qty 90, 90d supply, fill #0

## 2024-04-14 ENCOUNTER — Other Ambulatory Visit: Payer: Self-pay

## 2024-05-17 ENCOUNTER — Encounter: Payer: Self-pay | Admitting: Family

## 2024-05-17 NOTE — Progress Notes (Signed)
 Erroneous encounter-disregard

## 2024-06-26 ENCOUNTER — Encounter: Payer: Self-pay | Admitting: Family

## 2024-06-26 NOTE — Progress Notes (Signed)
 Erroneous encounter-disregard

## 2024-08-02 ENCOUNTER — Encounter: Payer: Self-pay | Admitting: Family

## 2024-08-02 ENCOUNTER — Ambulatory Visit (INDEPENDENT_AMBULATORY_CARE_PROVIDER_SITE_OTHER): Payer: Self-pay | Admitting: Family

## 2024-08-02 VITALS — BP 147/91 | HR 81 | Temp 98.7°F | Resp 16 | Ht 66.0 in | Wt 209.2 lb

## 2024-08-02 DIAGNOSIS — I10 Essential (primary) hypertension: Secondary | ICD-10-CM

## 2024-08-02 DIAGNOSIS — Z13228 Encounter for screening for other metabolic disorders: Secondary | ICD-10-CM

## 2024-08-02 DIAGNOSIS — Z603 Acculturation difficulty: Secondary | ICD-10-CM

## 2024-08-02 DIAGNOSIS — E785 Hyperlipidemia, unspecified: Secondary | ICD-10-CM

## 2024-08-02 MED ORDER — VALSARTAN 40 MG PO TABS: 40.0000 mg | ORAL_TABLET | Freq: Every day | ORAL | 0 refills | Status: AC

## 2024-08-02 MED ORDER — ATORVASTATIN CALCIUM 20 MG PO TABS: 20.0000 mg | ORAL_TABLET | Freq: Every day | ORAL | 0 refills | Status: AC

## 2024-08-02 NOTE — Progress Notes (Signed)
 "   Patient ID: Marc Chen, male    DOB: 06-29-1981  MRN: [id number]  CC: Chronic Conditions Follow-Up  Subjective: Marc Chen is a 44 y.o. male who presents for chronic conditions follow-up.   His concerns today include:  - Blood pressure check. He is trying to limit salt intake. He does not check blood pressure outside of office. He does not complain of red flag symptoms such as but not limited to chest pain, shortness of breath, worst headache of life, nausea/vomiting.  - States since previous office visit he did not begin taking Atorvastatin .  - Liver lab.  Patient Active Problem List   Diagnosis Date Noted   COVID-19 virus infection 05/23/2019   Bilateral carpal tunnel syndrome 09/23/2018   Prehypertension 09/23/2018   Varicose veins of left lower extremity with complications 09/10/2015     Medications Ordered Prior to Encounter[1]  Allergies[2]  Social History   Socioeconomic History   Marital status: Significant Other    Spouse name: Not on file   Number of children: Not on file   Years of education: 9 grade   Highest education level: Not on file  Occupational History   Not on file  Tobacco Use   Smoking status: Never   Smokeless tobacco: Never  Vaping Use   Vaping status: Never Used  Substance and Sexual Activity   Alcohol use: Yes    Alcohol/week: 1.0 standard drink of alcohol    Types: 1 Cans of beer per week   Drug use: No   Sexual activity: Yes  Other Topics Concern   Not on file  Social History Narrative   Not on file   Social Drivers of Health   Tobacco Use: Low Risk (08/02/2024)   Patient History    Smoking Tobacco Use: Never    Smokeless Tobacco Use: Never    Passive Exposure: Not on file  Financial Resource Strain: Low Risk (02/23/2024)   Overall Financial Resource Strain (CARDIA)    Difficulty of Paying Living Expenses: Not hard at all  Food Insecurity: No Food Insecurity (02/23/2024)   Epic    Worried About Patent Examiner in the Last Year: Never true    Ran Out of Food in the Last Year: Never true  Transportation Needs: No Transportation Needs (02/23/2024)   Epic    Lack of Transportation (Medical): No    Lack of Transportation (Non-Medical): No  Physical Activity: Inactive (02/23/2024)   Exercise Vital Sign    Days of Exercise per Week: 0 days    Minutes of Exercise per Session: 0 min  Stress: No Stress Concern Present (02/23/2024)   [employer] of Occupational Health - Occupational Stress Questionnaire    Feeling of Stress: Only a little  Social Connections: Moderately Integrated (02/23/2024)   Social Connection and Isolation Panel    Frequency of Communication with Friends and Family: More than three times a week    Frequency of Social Gatherings with Friends and Family: More than three times a week    Attends Religious Services: More than 4 times per year    Active Member of [employer] or Organizations: No    Attends Banker Meetings: Never    Marital Status: Living with partner  Intimate Partner Violence: Not At Risk (02/23/2024)   Epic    Fear of Current or Ex-Partner: No    Emotionally Abused: No    Physically Abused: No    Sexually Abused: No  Depression (PHQ2-9): Low  Risk (08/02/2024)   Depression (PHQ2-9)    PHQ-2 Score: 0  Alcohol Screen: Low Risk (02/23/2024)   Alcohol Screen    Last Alcohol Screening Score (AUDIT): 2  Housing: Unknown (02/23/2024)   Epic    Unable to Pay for Housing in the Last Year: No    Number of Times Moved in the Last Year: Not on file    Homeless in the Last Year: No  Utilities: Not At Risk (02/23/2024)   Epic    Threatened with loss of utilities: No  Health Literacy: Inadequate Health Literacy (02/23/2024)   B1300 Health Literacy    Frequency of need for help with medical instructions: Always    Family History  Problem Relation Age of Onset   Diabetes Father     Past Surgical History:  Procedure Laterality Date   LASER ABLATION Left     MENISCECTOMY Left     ROS: Review of Systems Negative except as stated above  PHYSICAL EXAM: BP (!) 147/91   Pulse 81   Temp 98.7 F (37.1 C) (Oral)   Resp 16   Ht 5' 6 (1.676 m)   Wt 209 lb 3.2 oz (94.9 kg)   SpO2 96%   BMI 33.77 kg/m   Physical Exam HENT:     Head: Normocephalic and atraumatic.     Nose: Nose normal.     Mouth/Throat:     Mouth: Mucous membranes are moist.     Pharynx: Oropharynx is clear.  Eyes:     Extraocular Movements: Extraocular movements intact.     Conjunctiva/sclera: Conjunctivae normal.     Pupils: Pupils are equal, round, and reactive to light.  Cardiovascular:     Rate and Rhythm: Normal rate and regular rhythm.     Pulses: Normal pulses.     Heart sounds: Normal heart sounds.  Pulmonary:     Effort: Pulmonary effort is normal.     Breath sounds: Normal breath sounds.  Musculoskeletal:        General: Normal range of motion.     Cervical back: Normal range of motion and neck supple.  Neurological:     General: No focal deficit present.     Mental Status: He is alert and oriented to person, place, and time.  Psychiatric:        Mood and Affect: Mood normal.        Behavior: Behavior normal.     ASSESSMENT AND PLAN: 1. Primary hypertension (Primary) - Blood pressure not at goal during today's visit. Patient asymptomatic without chest pressure, chest pain, palpitations, shortness of breath, worst headache of life, and any additional red flag symptoms. - Trial Valsartan  as prescribed.  - Routine screening.  - Counseled on blood pressure goal of less than 130/80, low-sodium, DASH diet, medication compliance, and 150 minutes of moderate intensity exercise per week as tolerated. Counseled on medication adherence and adverse effects. - Follow-up with primary provider in 4 weeks or sooner if needed.  - valsartan  (DIOVAN ) 40 MG tablet; Take 1 tablet (40 mg total) by mouth daily.  Dispense: 90 tablet; Refill: 0 - Basic Metabolic  Panel  2. Hyperlipidemia, unspecified hyperlipidemia type - Atorvastatin  as prescribed. Counseled on medication adherence/adverse effects.  - Routine screening.  - Follow-up with primary provider as scheduled.  - atorvastatin  (LIPITOR) 20 MG tablet; Take 1 tablet (20 mg total) by mouth daily.  Dispense: 90 tablet; Refill: 0 - Lipid panel; Future  3. Screening for metabolic disorder - Routine screening.  -  ALT  4. Language barrier - Patient speaking English.   Patient was given the opportunity to ask questions.  Patient verbalized understanding of the plan and was able to repeat key elements of the plan. Patient was given clear instructions to go to Emergency Department or return to medical center if symptoms don't improve, worsen, or new problems develop.The patient verbalized understanding.   Orders Placed This Encounter  Procedures   Lipid panel   Basic Metabolic Panel   ALT     Requested Prescriptions   Signed Prescriptions Disp Refills   valsartan  (DIOVAN ) 40 MG tablet 90 tablet 0    Sig: Take 1 tablet (40 mg total) by mouth daily.   atorvastatin  (LIPITOR) 20 MG tablet 90 tablet 0    Sig: Take 1 tablet (20 mg total) by mouth daily.    Return in about 4 weeks (around 08/30/2024) for Follow-Up or next available chronic conditions.  Greig JINNY Chute, NP      [1]  Current Outpatient Medications on File Prior to Visit  Medication Sig Dispense Refill   Lidocaine HCl 4 % CREA Aspercreme (lidocaine HCl) 4 % topical  Apply 1 application twice a day by topical route. (Patient not taking: Reported on 02/23/2024)     meloxicam  (MOBIC ) 7.5 MG tablet Take 1 tablet (7.5 mg total) by mouth daily. (Patient not taking: Reported on 04/04/2024) 90 tablet 0   No current facility-administered medications on file prior to visit.  [2] No Known Allergies  "

## 2024-08-02 NOTE — Progress Notes (Signed)
Follow up for bp

## 2024-08-03 LAB — LIPID PANEL
Chol/HDL Ratio: 6.2 ratio — ABNORMAL HIGH (ref 0.0–5.0)
Cholesterol, Total: 372 mg/dL — ABNORMAL HIGH (ref 100–199)
HDL: 60 mg/dL
LDL Chol Calc (NIH): 236 mg/dL — ABNORMAL HIGH (ref 0–99)
Triglycerides: 354 mg/dL — ABNORMAL HIGH (ref 0–149)
VLDL Cholesterol Cal: 76 mg/dL — ABNORMAL HIGH (ref 5–40)

## 2024-08-03 LAB — BASIC METABOLIC PANEL WITH GFR
BUN/Creatinine Ratio: 18 (ref 9–20)
BUN: 16 mg/dL (ref 6–24)
CO2: 22 mmol/L (ref 20–29)
Calcium: 10.4 mg/dL — ABNORMAL HIGH (ref 8.7–10.2)
Chloride: 100 mmol/L (ref 96–106)
Creatinine, Ser: 0.9 mg/dL (ref 0.76–1.27)
Glucose: 85 mg/dL (ref 70–99)
Potassium: 4.3 mmol/L (ref 3.5–5.2)
Sodium: 137 mmol/L (ref 134–144)
eGFR: 108 mL/min/1.73

## 2024-08-03 LAB — ALT: ALT: 44 IU/L (ref 0–44)

## 2024-08-04 ENCOUNTER — Ambulatory Visit: Payer: Self-pay | Admitting: Family

## 2024-08-04 DIAGNOSIS — E785 Hyperlipidemia, unspecified: Secondary | ICD-10-CM

## 2024-09-06 ENCOUNTER — Ambulatory Visit: Payer: Self-pay | Admitting: Family

## 2025-04-04 ENCOUNTER — Encounter: Payer: Self-pay | Admitting: Family
